# Patient Record
Sex: Male | Born: 1965 | Race: White | Hispanic: No | Marital: Single | Smoking: Never smoker
Health system: Southern US, Community
[De-identification: ages and names within clinical notes are randomized; demographics above are authoritative.]

## PROBLEM LIST (undated history)

## (undated) DIAGNOSIS — T7840XA Allergy, unspecified, initial encounter: Secondary | ICD-10-CM

## (undated) DIAGNOSIS — M5030 Other cervical disc degeneration, unspecified cervical region: Secondary | ICD-10-CM

## (undated) DIAGNOSIS — M5136 Other intervertebral disc degeneration, lumbar region: Secondary | ICD-10-CM

## (undated) DIAGNOSIS — D649 Anemia, unspecified: Secondary | ICD-10-CM

## (undated) DIAGNOSIS — G5603 Carpal tunnel syndrome, bilateral upper limbs: Secondary | ICD-10-CM

## (undated) DIAGNOSIS — G4730 Sleep apnea, unspecified: Secondary | ICD-10-CM

## (undated) DIAGNOSIS — M51369 Other intervertebral disc degeneration, lumbar region without mention of lumbar back pain or lower extremity pain: Secondary | ICD-10-CM

## (undated) DIAGNOSIS — D759 Disease of blood and blood-forming organs, unspecified: Secondary | ICD-10-CM

## (undated) DIAGNOSIS — F329 Major depressive disorder, single episode, unspecified: Secondary | ICD-10-CM

## (undated) DIAGNOSIS — G709 Myoneural disorder, unspecified: Secondary | ICD-10-CM

## (undated) DIAGNOSIS — E119 Type 2 diabetes mellitus without complications: Secondary | ICD-10-CM

## (undated) DIAGNOSIS — L739 Follicular disorder, unspecified: Secondary | ICD-10-CM

## (undated) DIAGNOSIS — F32A Depression, unspecified: Secondary | ICD-10-CM

## (undated) DIAGNOSIS — M797 Fibromyalgia: Secondary | ICD-10-CM

## (undated) DIAGNOSIS — R519 Headache, unspecified: Secondary | ICD-10-CM

## (undated) DIAGNOSIS — E785 Hyperlipidemia, unspecified: Secondary | ICD-10-CM

## (undated) HISTORY — PX: NASAL SEPTUM SURGERY: SHX37

## (undated) HISTORY — DX: Fibromyalgia: M79.7

## (undated) HISTORY — DX: Type 2 diabetes mellitus without complications: E11.9

## (undated) HISTORY — PX: WISDOM TOOTH EXTRACTION: SHX21

## (undated) HISTORY — DX: Carpal tunnel syndrome, bilateral upper limbs: G56.03

## (undated) HISTORY — PX: UPPER GI ENDOSCOPY: SHX6162

## (undated) HISTORY — DX: Other intervertebral disc degeneration, lumbar region: M51.36

## (undated) HISTORY — DX: Follicular disorder, unspecified: L73.9

## (undated) HISTORY — DX: Allergy, unspecified, initial encounter: T78.40XA

## (undated) HISTORY — DX: Major depressive disorder, single episode, unspecified: F32.9

## (undated) HISTORY — DX: Hyperlipidemia, unspecified: E78.5

## (undated) HISTORY — DX: Depression, unspecified: F32.A

## (undated) HISTORY — DX: Sleep apnea, unspecified: G47.30

## (undated) HISTORY — PX: OTHER SURGICAL HISTORY: SHX169

## (undated) HISTORY — DX: Other intervertebral disc degeneration, lumbar region without mention of lumbar back pain or lower extremity pain: M51.369

## (undated) HISTORY — DX: Anemia, unspecified: D64.9

## (undated) HISTORY — DX: Myoneural disorder, unspecified: G70.9

## (undated) HISTORY — DX: Other cervical disc degeneration, unspecified cervical region: M50.30

## (undated) HISTORY — PX: COLONOSCOPY: SHX174

---

## 1999-08-10 ENCOUNTER — Encounter: Admission: RE | Admit: 1999-08-10 | Discharge: 1999-08-10 | Payer: Self-pay | Admitting: Family Medicine

## 2000-10-11 ENCOUNTER — Ambulatory Visit (HOSPITAL_COMMUNITY): Admission: RE | Admit: 2000-10-11 | Discharge: 2000-10-11 | Payer: Self-pay | Admitting: Neurosurgery

## 2000-10-11 ENCOUNTER — Encounter: Payer: Self-pay | Admitting: Neurosurgery

## 2002-01-07 ENCOUNTER — Encounter: Admission: RE | Admit: 2002-01-07 | Discharge: 2002-01-07 | Payer: Self-pay | Admitting: Family Medicine

## 2002-01-07 ENCOUNTER — Encounter: Payer: Self-pay | Admitting: Family Medicine

## 2002-09-20 ENCOUNTER — Encounter: Payer: Self-pay | Admitting: Family Medicine

## 2002-09-20 ENCOUNTER — Encounter: Admission: RE | Admit: 2002-09-20 | Discharge: 2002-09-20 | Payer: Self-pay | Admitting: Family Medicine

## 2003-09-19 ENCOUNTER — Encounter: Payer: Self-pay | Admitting: Internal Medicine

## 2003-10-14 ENCOUNTER — Encounter: Payer: Self-pay | Admitting: Internal Medicine

## 2003-11-22 ENCOUNTER — Ambulatory Visit (HOSPITAL_COMMUNITY): Admission: RE | Admit: 2003-11-22 | Discharge: 2003-11-22 | Payer: Self-pay | Admitting: Internal Medicine

## 2003-11-22 ENCOUNTER — Encounter: Payer: Self-pay | Admitting: Internal Medicine

## 2003-11-22 ENCOUNTER — Encounter (INDEPENDENT_AMBULATORY_CARE_PROVIDER_SITE_OTHER): Payer: Self-pay | Admitting: Specialist

## 2003-12-13 ENCOUNTER — Encounter: Payer: Self-pay | Admitting: Internal Medicine

## 2004-04-02 ENCOUNTER — Encounter: Admission: RE | Admit: 2004-04-02 | Discharge: 2004-04-02 | Payer: Self-pay | Admitting: Family Medicine

## 2004-04-19 ENCOUNTER — Encounter: Admission: RE | Admit: 2004-04-19 | Discharge: 2004-04-19 | Payer: Self-pay | Admitting: Family Medicine

## 2004-05-07 ENCOUNTER — Encounter: Admission: RE | Admit: 2004-05-07 | Discharge: 2004-05-07 | Payer: Self-pay | Admitting: Family Medicine

## 2004-05-23 ENCOUNTER — Encounter: Admission: RE | Admit: 2004-05-23 | Discharge: 2004-05-23 | Payer: Self-pay | Admitting: Family Medicine

## 2004-09-11 ENCOUNTER — Ambulatory Visit: Payer: Self-pay | Admitting: Internal Medicine

## 2004-10-11 ENCOUNTER — Ambulatory Visit: Payer: Self-pay | Admitting: Internal Medicine

## 2004-11-13 ENCOUNTER — Ambulatory Visit: Payer: Self-pay | Admitting: Internal Medicine

## 2004-12-12 ENCOUNTER — Ambulatory Visit: Payer: Self-pay | Admitting: Internal Medicine

## 2005-01-09 ENCOUNTER — Ambulatory Visit: Payer: Self-pay | Admitting: Internal Medicine

## 2005-01-19 IMAGING — US US BIOPSY
1 series · 9 of 9 positions shown · non-contrast
Comparison: none

CLINICAL DATA: 37-year-old male with iron overload. Request to perform liver core biopsy. 
ULTRASOUND GUIDED RANDOM LIVER CORE BIOPSY 11/22/03
The above procedure was thoroughly discussed with the patient and questions were answered.  Risks, benefits and alternatives were also delineated. Risks specifically discussed included bleeding, bruising, infection, and less likely death. The patient understood and wishes to proceed. Verbal as well as written consent was obtained.
Ultrasound was first performed to localize an adequate site for the biopsy. This area was then marked. The patient was then prepped and draped in a normal sterile fashion. One percent lidocaine was used for local anesthesia. Using direct ultrasound guidance, a 17-gauge Trocar type needle was then introduced into the periphery of the right hepatic lobe. An 18-gauge core biopsy gun was then advanced to obtain four core specimens. The specimens were then placed in saline and sent to pathology for further analysis. Post-procedure imaging demonstrated no hematoma. The patient tolerated the procedure and there were no immediate complications. The patient was then sent to the outpatient medical unit to be observed for three hours post-procedure. 
Patient received 10 mg PO Valium prior to the start of the procedure. 
Dr. Damarys Jaeger supervised the above procedure. 
IMPRESSION
Successful ultrasound guided random liver core biopsy.

[Series 1: unknown · 0.32mm/px · 9 of 9 slices shown]
[im 1/9]
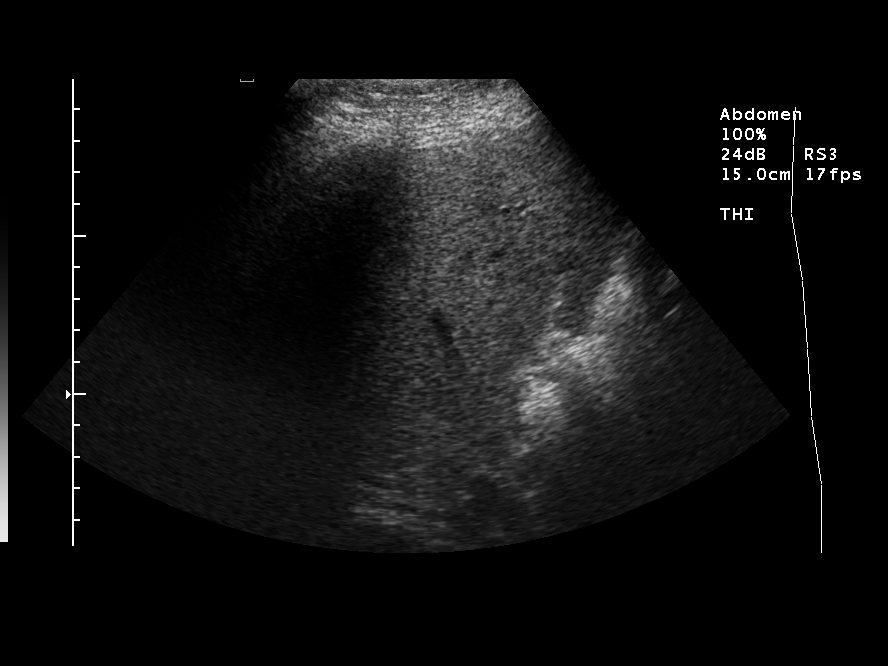
[im 2/9]
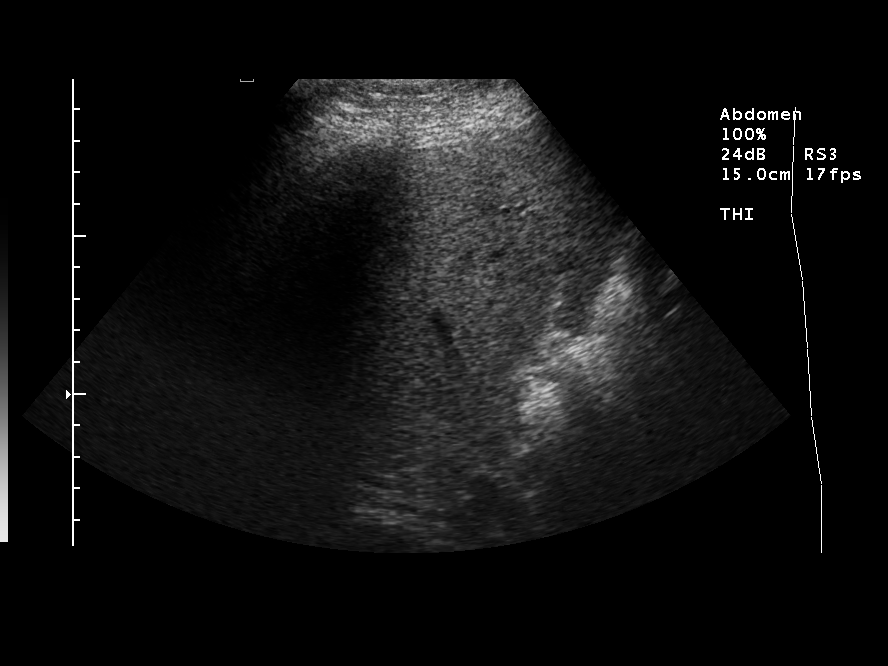
[im 3/9]
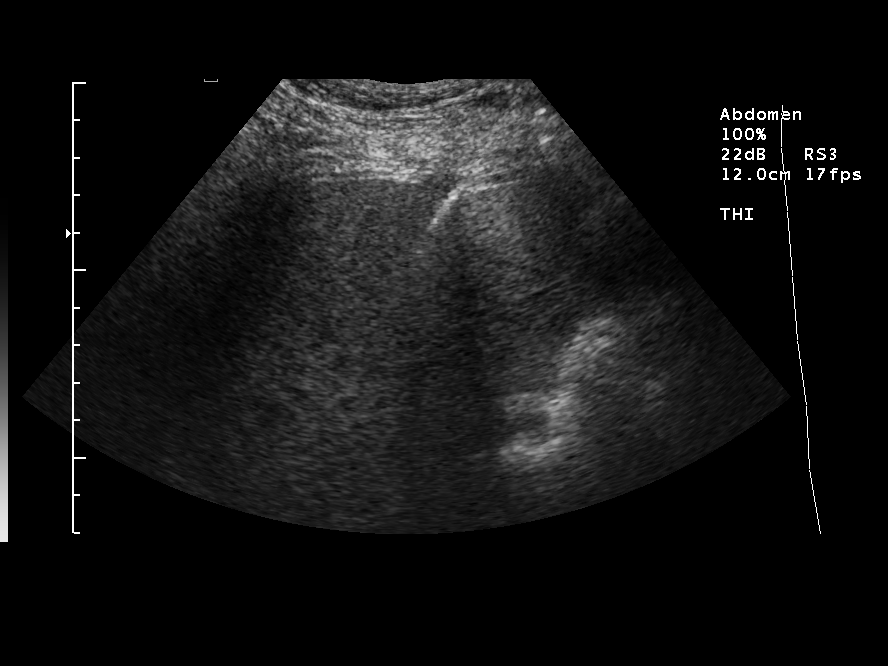
[im 4/9]
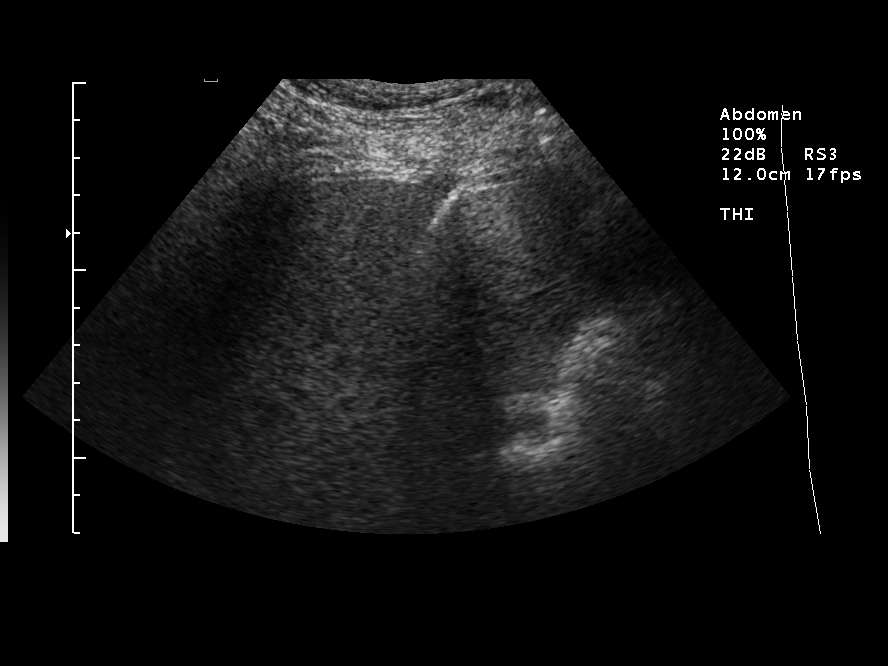
[im 5/9]
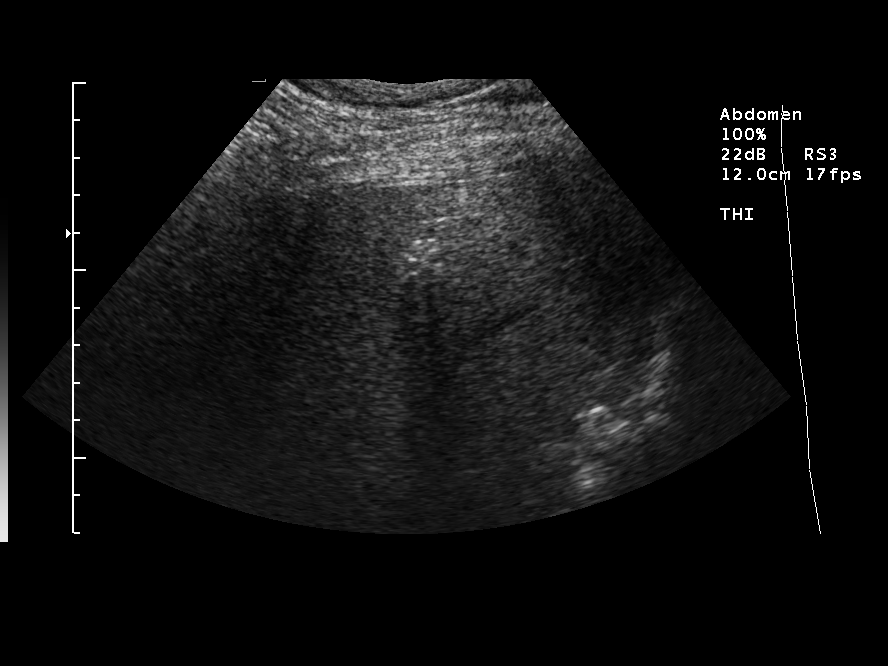
[im 6/9]
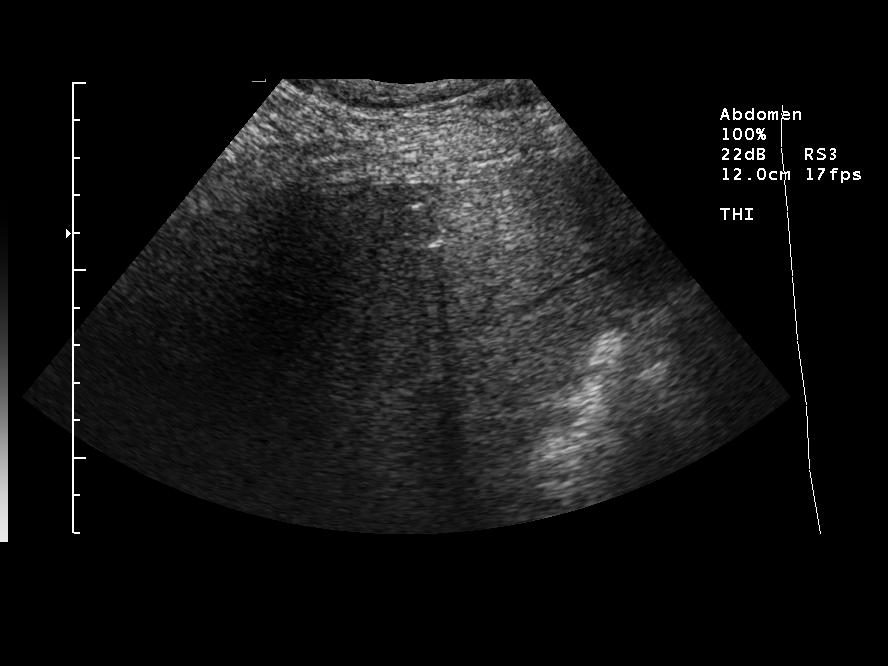
[im 7/9]
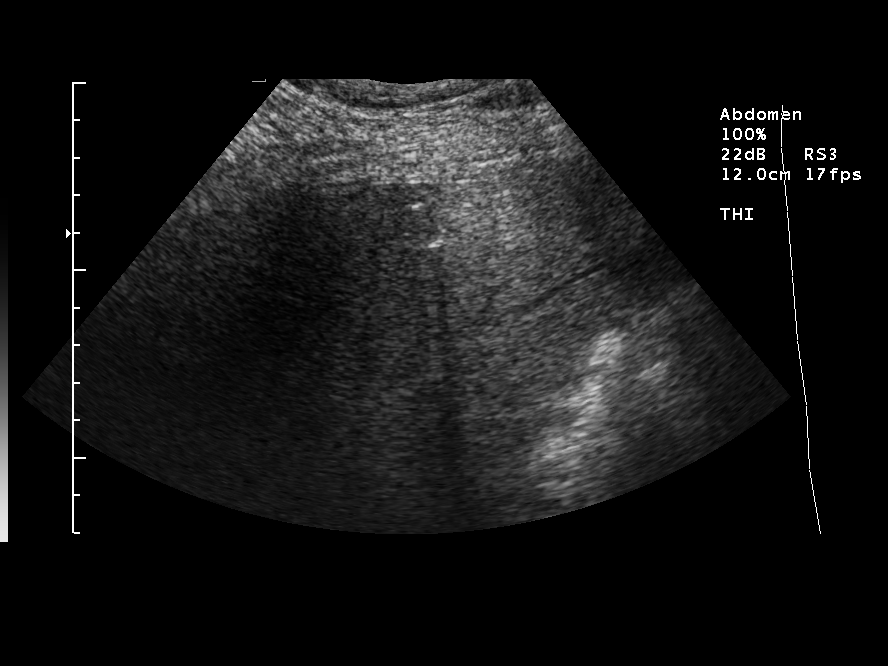
[im 8/9]
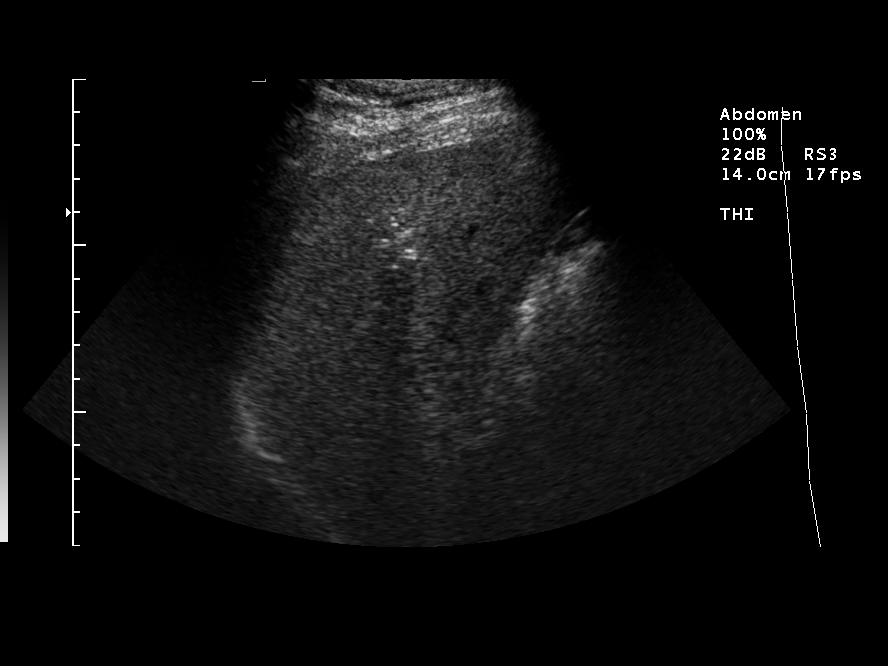
[im 9/9]
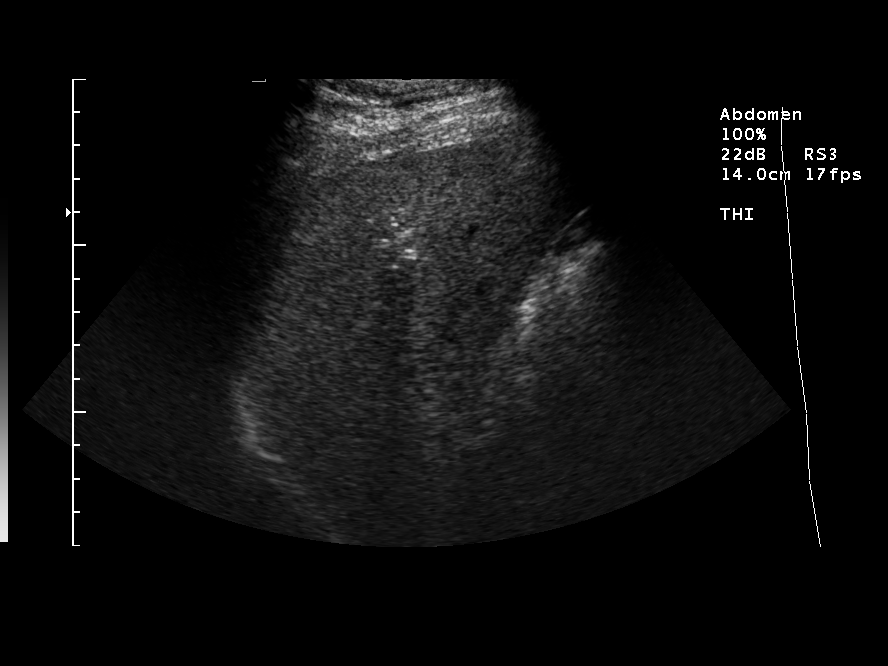

[9 of 9 positions shown; findings below may reference images not displayed]

## 2005-02-11 ENCOUNTER — Ambulatory Visit: Payer: Self-pay | Admitting: Internal Medicine

## 2005-02-11 ENCOUNTER — Encounter: Admission: RE | Admit: 2005-02-11 | Discharge: 2005-05-12 | Payer: Self-pay | Admitting: Neurological Surgery

## 2005-03-11 ENCOUNTER — Ambulatory Visit: Payer: Self-pay | Admitting: Internal Medicine

## 2005-04-02 ENCOUNTER — Ambulatory Visit: Payer: Self-pay | Admitting: Internal Medicine

## 2005-05-08 ENCOUNTER — Ambulatory Visit: Payer: Self-pay | Admitting: Internal Medicine

## 2005-06-28 ENCOUNTER — Ambulatory Visit: Payer: Self-pay | Admitting: Internal Medicine

## 2005-10-10 ENCOUNTER — Ambulatory Visit: Payer: Self-pay | Admitting: Pulmonary Disease

## 2005-10-10 ENCOUNTER — Ambulatory Visit: Payer: Self-pay | Admitting: Internal Medicine

## 2005-11-12 ENCOUNTER — Ambulatory Visit: Payer: Self-pay | Admitting: Pulmonary Disease

## 2005-12-13 ENCOUNTER — Ambulatory Visit: Payer: Self-pay | Admitting: Pulmonary Disease

## 2006-01-05 ENCOUNTER — Ambulatory Visit (HOSPITAL_BASED_OUTPATIENT_CLINIC_OR_DEPARTMENT_OTHER): Admission: RE | Admit: 2006-01-05 | Discharge: 2006-01-05 | Payer: Self-pay | Admitting: Pulmonary Disease

## 2006-01-14 ENCOUNTER — Ambulatory Visit: Payer: Self-pay | Admitting: Pulmonary Disease

## 2006-01-27 ENCOUNTER — Ambulatory Visit: Payer: Self-pay | Admitting: Pulmonary Disease

## 2006-06-06 ENCOUNTER — Ambulatory Visit: Payer: Self-pay | Admitting: Internal Medicine

## 2006-06-10 ENCOUNTER — Ambulatory Visit: Payer: Self-pay | Admitting: Internal Medicine

## 2006-07-18 ENCOUNTER — Ambulatory Visit: Payer: Self-pay | Admitting: Internal Medicine

## 2006-08-15 ENCOUNTER — Ambulatory Visit: Payer: Self-pay | Admitting: Internal Medicine

## 2006-08-15 LAB — CONVERTED CEMR LAB: HCT: 41.5 % (ref 39.0–52.0)

## 2006-09-11 ENCOUNTER — Ambulatory Visit: Payer: Self-pay | Admitting: Internal Medicine

## 2006-09-11 LAB — CONVERTED CEMR LAB
HCT: 41.5 % (ref 39.0–52.0)
Hemoglobin: 14.4 g/dL (ref 13.0–17.0)

## 2006-10-10 ENCOUNTER — Ambulatory Visit: Payer: Self-pay | Admitting: Internal Medicine

## 2006-10-10 LAB — CONVERTED CEMR LAB: Ferritin: 27.3 ng/mL (ref 22.0–322.0)

## 2006-12-19 ENCOUNTER — Ambulatory Visit: Payer: Self-pay | Admitting: Internal Medicine

## 2006-12-19 LAB — CONVERTED CEMR LAB: Hemoglobin: 14.5 g/dL (ref 13.0–17.0)

## 2007-02-13 ENCOUNTER — Ambulatory Visit: Payer: Self-pay | Admitting: Internal Medicine

## 2007-02-18 ENCOUNTER — Observation Stay (HOSPITAL_COMMUNITY): Admission: RE | Admit: 2007-02-18 | Discharge: 2007-02-19 | Payer: Self-pay | Admitting: Otolaryngology

## 2007-04-17 ENCOUNTER — Ambulatory Visit: Payer: Self-pay | Admitting: Internal Medicine

## 2007-04-17 LAB — CONVERTED CEMR LAB
HCT: 39.6 % (ref 39.0–52.0)
Hemoglobin: 14 g/dL (ref 13.0–17.0)

## 2007-06-12 ENCOUNTER — Ambulatory Visit: Payer: Self-pay | Admitting: Internal Medicine

## 2007-06-12 LAB — CONVERTED CEMR LAB
Basophils Absolute: 0 10*3/uL (ref 0.0–0.1)
Eosinophils Absolute: 0.1 10*3/uL (ref 0.0–0.6)
Eosinophils Relative: 2.4 % (ref 0.0–5.0)
Lymphocytes Relative: 32.6 % (ref 12.0–46.0)
MCHC: 35.3 g/dL (ref 30.0–36.0)
MCV: 93.6 fL (ref 78.0–100.0)
Neutro Abs: 2.9 10*3/uL (ref 1.4–7.7)

## 2007-08-12 ENCOUNTER — Ambulatory Visit: Payer: Self-pay | Admitting: Internal Medicine

## 2007-10-16 ENCOUNTER — Ambulatory Visit: Payer: Self-pay | Admitting: Internal Medicine

## 2007-10-16 LAB — CONVERTED CEMR LAB: HCT: 41 % (ref 39.0–52.0)

## 2007-12-10 ENCOUNTER — Ambulatory Visit: Payer: Self-pay | Admitting: Internal Medicine

## 2007-12-10 LAB — CONVERTED CEMR LAB
Ferritin: 33.1 ng/mL (ref 22.0–322.0)
Hemoglobin: 15 g/dL (ref 13.0–17.0)

## 2008-02-12 ENCOUNTER — Ambulatory Visit: Payer: Self-pay | Admitting: Internal Medicine

## 2008-04-08 ENCOUNTER — Ambulatory Visit: Payer: Self-pay | Admitting: Internal Medicine

## 2008-04-13 LAB — CONVERTED CEMR LAB: Ferritin: 28 ng/mL (ref 22.0–322.0)

## 2008-06-17 ENCOUNTER — Ambulatory Visit: Payer: Self-pay | Admitting: Internal Medicine

## 2008-06-21 LAB — CONVERTED CEMR LAB
HCT: 39.8 % (ref 39.0–52.0)
Hemoglobin: 14.1 g/dL (ref 13.0–17.0)

## 2008-08-09 ENCOUNTER — Ambulatory Visit: Payer: Self-pay | Admitting: Internal Medicine

## 2008-10-19 ENCOUNTER — Ambulatory Visit: Payer: Self-pay | Admitting: Internal Medicine

## 2008-10-26 LAB — CONVERTED CEMR LAB: Ferritin: 35.2 ng/mL (ref 22.0–322.0)

## 2008-11-07 ENCOUNTER — Encounter: Payer: Self-pay | Admitting: Internal Medicine

## 2008-11-25 ENCOUNTER — Encounter: Payer: Self-pay | Admitting: Internal Medicine

## 2008-12-16 ENCOUNTER — Ambulatory Visit: Payer: Self-pay | Admitting: Internal Medicine

## 2008-12-20 LAB — CONVERTED CEMR LAB
Ferritin: 27.8 ng/mL (ref 22.0–322.0)
HCT: 43.1 % (ref 39.0–52.0)
Hemoglobin: 15.2 g/dL (ref 13.0–17.0)

## 2009-01-25 ENCOUNTER — Encounter: Payer: Self-pay | Admitting: Internal Medicine

## 2009-01-29 ENCOUNTER — Encounter: Payer: Self-pay | Admitting: Internal Medicine

## 2009-01-30 ENCOUNTER — Encounter: Payer: Self-pay | Admitting: Internal Medicine

## 2009-02-10 ENCOUNTER — Ambulatory Visit: Payer: Self-pay | Admitting: Internal Medicine

## 2009-04-11 ENCOUNTER — Ambulatory Visit: Payer: Self-pay | Admitting: Internal Medicine

## 2009-04-12 LAB — CONVERTED CEMR LAB
HCT: 39.2 % (ref 39.0–52.0)
Hemoglobin: 14.1 g/dL (ref 13.0–17.0)

## 2009-06-09 ENCOUNTER — Ambulatory Visit: Payer: Self-pay | Admitting: Internal Medicine

## 2009-07-10 LAB — CONVERTED CEMR LAB
HCT: 44.7 % (ref 39.0–52.0)
Hemoglobin: 15.2 g/dL (ref 13.0–17.0)

## 2009-08-05 ENCOUNTER — Encounter: Payer: Self-pay | Admitting: Internal Medicine

## 2009-08-16 ENCOUNTER — Ambulatory Visit: Payer: Self-pay | Admitting: Internal Medicine

## 2009-08-17 LAB — CONVERTED CEMR LAB: Hemoglobin: 15.9 g/dL (ref 13.0–17.0)

## 2009-08-23 ENCOUNTER — Telehealth: Payer: Self-pay | Admitting: Internal Medicine

## 2009-09-01 ENCOUNTER — Encounter: Payer: Self-pay | Admitting: Internal Medicine

## 2009-10-10 ENCOUNTER — Ambulatory Visit: Payer: Self-pay | Admitting: Internal Medicine

## 2009-10-12 LAB — CONVERTED CEMR LAB: Hemoglobin: 15.6 g/dL (ref 13.0–17.0)

## 2009-12-15 ENCOUNTER — Ambulatory Visit: Payer: Self-pay | Admitting: Internal Medicine

## 2009-12-18 LAB — CONVERTED CEMR LAB: HCT: 42 % (ref 39.0–52.0)

## 2009-12-28 ENCOUNTER — Encounter (INDEPENDENT_AMBULATORY_CARE_PROVIDER_SITE_OTHER): Payer: Self-pay | Admitting: *Deleted

## 2010-01-30 ENCOUNTER — Ambulatory Visit: Payer: Self-pay | Admitting: Internal Medicine

## 2010-02-08 ENCOUNTER — Ambulatory Visit: Payer: Self-pay | Admitting: Internal Medicine

## 2010-02-08 ENCOUNTER — Encounter: Admission: RE | Admit: 2010-02-08 | Discharge: 2010-02-08 | Payer: Self-pay | Admitting: Family Medicine

## 2010-02-09 LAB — CONVERTED CEMR LAB
Ferritin: 24.1 ng/mL
HCT: 43.5 %
Hemoglobin: 15.2 g/dL

## 2010-04-20 ENCOUNTER — Ambulatory Visit: Payer: Self-pay | Admitting: Internal Medicine

## 2010-06-15 ENCOUNTER — Ambulatory Visit: Payer: Self-pay | Admitting: Internal Medicine

## 2010-06-19 LAB — CONVERTED CEMR LAB: Hemoglobin: 14.8 g/dL (ref 13.0–17.0)

## 2010-08-14 ENCOUNTER — Ambulatory Visit: Payer: Self-pay | Admitting: Internal Medicine

## 2010-08-14 LAB — CONVERTED CEMR LAB
Basophils Relative: 0.8 % (ref 0.0–3.0)
Eosinophils Absolute: 0.1 10*3/uL (ref 0.0–0.7)
Hemoglobin: 14.7 g/dL (ref 13.0–17.0)
Lymphocytes Relative: 25.2 % (ref 12.0–46.0)
MCV: 95 fL (ref 78.0–100.0)
Neutrophils Relative %: 60 % (ref 43.0–77.0)
Platelets: 222 10*3/uL (ref 150.0–400.0)
RBC: 4.36 M/uL (ref 4.22–5.81)
RDW: 13 % (ref 11.5–14.6)
WBC: 5.4 10*3/uL (ref 4.5–10.5)

## 2010-08-15 ENCOUNTER — Telehealth (INDEPENDENT_AMBULATORY_CARE_PROVIDER_SITE_OTHER): Payer: Self-pay | Admitting: *Deleted

## 2010-10-15 ENCOUNTER — Ambulatory Visit: Payer: Self-pay | Admitting: Internal Medicine

## 2010-10-23 LAB — CONVERTED CEMR LAB
Basophils Relative: 0.3 % (ref 0.0–3.0)
Eosinophils Relative: 2 % (ref 0.0–5.0)
Hemoglobin: 14.2 g/dL (ref 13.0–17.0)
Lymphocytes Relative: 24 % (ref 12.0–46.0)
MCHC: 35.1 g/dL (ref 30.0–36.0)
Monocytes Relative: 10.1 % (ref 3.0–12.0)
Neutro Abs: 3.3 10*3/uL (ref 1.4–7.7)
Neutrophils Relative %: 63.6 % (ref 43.0–77.0)
RBC: 4.24 M/uL (ref 4.22–5.81)
WBC: 5.3 10*3/uL (ref 4.5–10.5)

## 2010-11-26 ENCOUNTER — Other Ambulatory Visit: Payer: Self-pay | Admitting: Family Medicine

## 2010-11-26 DIAGNOSIS — R110 Nausea: Secondary | ICD-10-CM

## 2010-11-26 DIAGNOSIS — R1011 Right upper quadrant pain: Secondary | ICD-10-CM

## 2010-11-27 NOTE — Letter (Signed)
Summary: New Patient letter  [facility]  [address] [place], [place] [address]   Phone: [phone]  Fax: [phone]       12/28/2009 MRN: [id number]  [facility] [address] [place], [place]  [address]  Dear [name],  Welcome to the Gastroenterology Division at [facility].    You are scheduled to see Dr.  Stan Head on January 30, 2010 at 9:00am on the 3rd floor at [facility], [address]  We ask that you try to arrive at our office 15 minutes prior to your appointment time to allow for check-in.  We would like you to complete the enclosed self-administered evaluation form prior to your visit and bring it with you on the day of your appointment.  We will review it with you.  Also, please bring a complete list of all your medications or, if you prefer, bring the medication bottles and we will list them.  Please bring your insurance card so that we may make a copy of it.  If your insurance requires a referral to see a specialist, please bring your referral form from your primary care physician.  Co-payments are due at the time of your visit and may be paid by cash, check or credit card.     Your office visit will consist of a consult with your physician (includes a physical exam), any laboratory testing he/she may order, scheduling of any necessary diagnostic testing (e.g. x-ray, ultrasound, CT-scan), and scheduling of a procedure (e.g. Endoscopy, Colonoscopy) if required.  Please allow enough time on your schedule to allow for any/all of these possibilities.    If you cannot keep your appointment, please call [phone] to cancel or reschedule prior to your appointment date.  This allows [place] the opportunity to schedule an appointment for another patient in need of care.  If you do not cancel or reschedule by 5 p.m. the business day prior to your appointment date, you will be charged a $50.00 late cancellation/no-show fee.    Thank you for  choosing [facility] for your medical needs.  We appreciate the opportunity to care for you.  Please visit [place] at our website  to learn more about our practice.                     Sincerely,                                                             The Gastroenterology Division

## 2010-11-27 NOTE — Assessment & Plan Note (Signed)
Summary: f.u after blood work...em   History of Present Illness Visit Type: Initial Visit Primary GI MD: Stan Head MD [facility] Primary Provider: Mosetta Putt, MD Chief Complaint: Hemochromatosis History of Present Illness:   No GI complaints. Here for follow-up of hemchromatosis. Fibromyalgia and back/neck pain are main issues, intermittent. If he overdoes it gets sore all over.   GI Review of Systems      Denies abdominal pain, acid reflux, belching, bloating, chest pain, dysphagia with liquids, dysphagia with solids, heartburn, loss of appetite, nausea, vomiting, vomiting blood, weight loss, and  weight gain.        Denies anal fissure, black tarry stools, change in bowel habit, constipation, diarrhea, diverticulosis, fecal incontinence, heme positive stool, hemorrhoids, irritable bowel syndrome, jaundice, light color stool, liver problems, rectal bleeding, and  rectal pain. Preventive Screening-Counseling & Management  Alcohol-Tobacco     Smoking Status: never      Drug Use:  no.      Liver Biopsy  Procedure date:  11/22/2003  Findings:      Mild steatosis and increased iron Hepatic iron index 1.6   Current Medications (verified): 1)  Hydrocodone-Acetaminophen 5-500 Mg Tabs (Hydrocodone-Acetaminophen) .... As Needed For Pain 2)  Gabapentin 600 Mg Tabs (Gabapentin) .... Take 1 -2 Tablets Daily As Needed 3)  Cyclobenzaprine Hcl 10 Mg Tabs (Cyclobenzaprine Hcl) .... As Needed 4)  Nabumetone 750 Mg Tabs (Nabumetone) .... Take 1 Tablet Two Times A Day After Meals 5)  Amitriptyline Hcl 25 Mg Tabs (Amitriptyline Hcl) .... Take 2 -3 Tablet At Bedtime 6)  Lipitor 40 Mg Tabs (Atorvastatin Calcium) .... 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  Alcohol Use - yes 1 drink to 2-3 drinks/week Daily Caffeine Use Illicit Drug Use - no Smoking Status:  never Drug Use:  no  Review of Systems       The patient complains of allergy/sinus and back pain.    Vital Signs:  Patient profile:   45 year old male Height:      68 inches Weight:      188.38 pounds BMI:     28.75 Pulse rate:   76 / minute Pulse rhythm:   regular BP sitting:   130 / 70  (left arm) Cuff size:   regular  Vitals Entered By: June McMurray CMA Duncan Dull) (January 30, 2010 8:59 AM)  Physical Exam  General:  Well developed, well nourished, no acute distress. Eyes:  anicteric Lungs:  Clear throughout to auscultation. Heart:  Regular rate and rhythm; no murmurs, rubs,  or bruits. Abdomen:  Soft, nontender and nondistended. No masses, hepatosplenomegaly or hernias noted. Normal bowel sounds. 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  2)  Continue with phlebotomy  plan as you are and avoiding foods high in iron. 3)  Copy sent to : Mosetta Putt, MD 4)  The medication list was reviewed and reconciled.  All changed / newly prescribed medications were explained.  A complete medication list was provided to the patient / caregiver.

## 2010-11-28 ENCOUNTER — Ambulatory Visit
Admission: RE | Admit: 2010-11-28 | Discharge: 2010-11-28 | Disposition: A | Discharge status: Home / Self Care | Payer: PRIVATE HEALTH INSURANCE | Source: Ambulatory Visit | Attending: Family Medicine | Admitting: Family Medicine

## 2010-11-28 DIAGNOSIS — R110 Nausea: Secondary | ICD-10-CM

## 2010-11-28 DIAGNOSIS — R1011 Right upper quadrant pain: Secondary | ICD-10-CM

## 2010-11-29 NOTE — Progress Notes (Signed)
Summary: continue every 2 month phlebotomy, ferritin is 22  Phone Note Outgoing Call Call back at Work Phone [phone]   Summary of Call: let him know things are ok with iron level  continue phlebotomy every 2 months (do not need to check ferritin til I say to again) Iva Boop MD, [facility]  August 15, 2010 3:10 PM   Follow-up for Phone Call        pt notified of above.  Order entered into IDX.  Flag to myself to call and remind pt to come for phlebotomy. Follow-up by: Francee Piccolo CMA Duncan Dull),  August 16, 2010 2:34 PM     Appended Document: continue every 2 month phlebotomy, ferritin is 22 message left with pt wife that pt is due for phlebotomy next week

## 2010-12-20 ENCOUNTER — Other Ambulatory Visit: Payer: Self-pay | Admitting: Internal Medicine

## 2010-12-20 ENCOUNTER — Other Ambulatory Visit: Payer: PRIVATE HEALTH INSURANCE

## 2010-12-20 ENCOUNTER — Encounter (INDEPENDENT_AMBULATORY_CARE_PROVIDER_SITE_OTHER): Payer: Self-pay | Admitting: *Deleted

## 2010-12-20 LAB — CBC WITH DIFFERENTIAL/PLATELET
Basophils Absolute: 0 10*3/uL (ref 0.0–0.1)
Basophils Relative: 0.5 % (ref 0.0–3.0)
Eosinophils Absolute: 0.1 10*3/uL (ref 0.0–0.7)
HCT: 41.2 % (ref 39.0–52.0)
Hemoglobin: 14.6 g/dL (ref 13.0–17.0)
Lymphs Abs: 1.5 10*3/uL (ref 0.7–4.0)
MCHC: 35.5 g/dL (ref 30.0–36.0)
MCV: 95.3 fl (ref 78.0–100.0)
Neutro Abs: 3.4 10*3/uL (ref 1.4–7.7)
RBC: 4.33 Mil/uL (ref 4.22–5.81)
RDW: 12.8 % (ref 11.5–14.6)

## 2011-02-13 ENCOUNTER — Telehealth: Payer: Self-pay | Admitting: *Deleted

## 2011-02-13 ENCOUNTER — Other Ambulatory Visit (INDEPENDENT_AMBULATORY_CARE_PROVIDER_SITE_OTHER): Payer: PRIVATE HEALTH INSURANCE

## 2011-02-13 LAB — CBC WITH DIFFERENTIAL/PLATELET
Basophils Absolute: 0 10*3/uL (ref 0.0–0.1)
Eosinophils Absolute: 0.1 10*3/uL (ref 0.0–0.7)
Hemoglobin: 14.9 g/dL (ref 13.0–17.0)
Lymphocytes Relative: 24.3 % (ref 12.0–46.0)
Lymphs Abs: 1.3 10*3/uL (ref 0.7–4.0)
MCHC: 35.4 g/dL (ref 30.0–36.0)
Monocytes Relative: 7.5 % (ref 3.0–12.0)
Neutro Abs: 3.5 10*3/uL (ref 1.4–7.7)
Platelets: 209 10*3/uL (ref 150.0–400.0)
RDW: 13 % (ref 11.5–14.6)

## 2011-02-13 LAB — FERRITIN: Ferritin: 17.7 ng/mL — ABNORMAL LOW (ref 22.0–322.0)

## 2011-02-13 NOTE — Telephone Encounter (Signed)
Patient aware of Dr. Marvell Fuller recommendations

## 2011-02-13 NOTE — Progress Notes (Signed)
Quick Note:  Continue with phlebotomy every 2 months ______

## 2011-02-13 NOTE — Telephone Encounter (Signed)
Message copied by Jesse Fall on Wed Feb 13, 2011  3:04 PM ------      Message from: Stan Head      Created: Wed Feb 13, 2011  1:10 PM       Continue with phlebotomy every 2 months

## 2011-02-14 ENCOUNTER — Other Ambulatory Visit: Payer: Self-pay | Admitting: Internal Medicine

## 2011-02-19 ENCOUNTER — Other Ambulatory Visit: Payer: Self-pay | Admitting: Family Medicine

## 2011-03-15 NOTE — Assessment & Plan Note (Signed)
[facility]                           GASTROENTEROLOGY OFFICE NOTE   NAME:[name], [name]                     MRN:          [id number]  DATE:06/10/2006                            DOB:          01/04/1966   CHIEF COMPLAINT:  Followup of hemochromatosis.   HISTORY:  [name] has been well without any GI complaints.  He was due  to have labs in April but somehow, we did not notify him.  His ferritin had  dropped to below 50 back in December 2006.  He has not had phlebotomy for  about 6 months.  Perhaps a little longer.  He had seen [name] for  obstructive sleep apnea but could not tolerate the BiPAP and had it changed  but has not been back to see him and he is not using the BiPAP.   CURRENT MEDICATIONS:  Listed and reviewed in the chart.   He has lost a few pounds intentionally and his pain medications have been  altered a little bit, reduced in dose.  He has a chronic pain syndrome.   PAST MEDICAL HISTORY:  1. Chronic pain from cervical disease.  2. History of dyslipidemia.  3. Obstructive sleep apnea, documented on nocturnal polysomnography.   VITAL SIGNS:  Weight 174 pounds, pulse 78, blood pressure 128/76.   ASSESSMENT:  Hemachromatosis.   We had depleted his Coffey significantly previously.  His ferritin now is  actually 104.   PLAN:  He needs to restart phlebotomy which we will do monthly and then in  November at his phlebotomy, I will recheck a ferritin, and a CBC at that  time.  The plan is to keep his ferritin less than 50.  May need to go to an  every other month schedule, that way he stays on some sort of schedule and  we are less likely to loose track of things.   Note, I have instructed him to obtain followup with [name].  The plan  was for followup six months or p.r.n. and since he is off the BiPAP, it  sounds like he needs to go back and discuss options.  It looks like Requip  was going to be considered.                              Iva Boop, MD, [facility]   [name]  DD:  06/10/2006  DT:  06/10/2006  Job #:  [id number]   cc:   Brian Putt, MD  Brian Share, MD, [facility]

## 2011-03-15 NOTE — Op Note (Signed)
NAME:  [name], [name]              ACCOUNT NO.:  [id number]   MEDICAL RECORD NO.:  [id number]          PATIENT TYPE:  AMB   LOCATION:  SDS                          FACILITY:  [facility]   PHYSICIAN:  Kinnie Scales. Annalee Genta, M.D.DATE OF BIRTH:  Apr 13, 1966   DATE OF PROCEDURE:  02/18/2007  DATE OF DISCHARGE:                               OPERATIVE REPORT   POSTOPERATIVE DIAGNOSES:  1. Deviated nasal septum with nasal airway obstruction.  2. Inferior turbinate hypertrophy.  3. Obstructive sleep apnea.   INDICATIONS FOR SURGERY:  1. Deviated nasal septum with nasal airway obstruction.  2. Inferior turbinate hypertrophy.  3. Obstructive sleep apnea.   SURGICAL PROCEDURES:  1. Nasal septoplasty.  2. Bilateral inferior turbinate reduction.   SURGEON:  Kinnie Scales. Annalee Genta, M.D.   COMPLICATIONS:  None.   ESTIMATED BLOOD LOSS:  Approximately 50 mL.   COMPLICATIONS:  None.  The patient transferred to the operating room to  the recovery room in stable condition.   BRIEF HISTORY:  [name] is a 45 year old white male who is referred  for evaluation of airway obstruction.  He had a history of obstructive  sleep apnea and complained of significant issues with daytime fatigue  and hypersomnolence.  He was diagnosed with severe obstructive sleep  apnea and used CPAP and BiPAP.  Unfortunately, he had significant  difficulty wearing the device because of airway obstruction and  congestion.  He was referred to our office for further workup and  treatment.  Evaluation in the office showed a severely deviated septum  with septal spurring at a rate of 90% airway obstruction.  He had  bilateral inferior turbinate hypertrophy which contributed to his  difficulty wearing CPAP.  Given his history, examination and findings, I  recommended he undertake nasal surgery to improve nasal airway and CPAP  compliance.  The risks, benefits and possible complications of the  procedure were discussed in detail with  the patient prior to surgery.  He understood and concurred with our plan which was scheduled for [facility] OR with an overnight postoperative observation.   DESCRIPTION OF PROCEDURE:  The patient was brought to the operating room  on 02/18/2007, placed in the supine position on the operating table.  General endotracheal anesthesia was established without difficulty.  The  patient was adequately anesthetized.  His nasal cavity was examined and  he was injected with 8 mL of 1% lidocaine with 1:100,000 solution  epinephrine in submucosal fashion along the nasal septum and inferior  turbinates bilaterally.  The patient's nose was then packed with Afrin-  soaked cottonoid pledgets which were left in place for approximately 10  minutes to allow for vasoconstriction and hemostasis.  The patient was  positioned on the operating room, prepped and draped in sterile fashion  and surgical procedure was begun.   A left hemitransfixion incision was created and mucoperichondrial flap  was elevated from anterior to posterior along the patient's left-hand  side.  Deviated bone and cartilage were mobilized and the bony  cartilaginous junction was crossed in the midline elevating the  mucoperiosteal flap.  Along the  patient's right anterior and dorsal  columellar, cartilage was not deviated and this was left intact.  Mid  septal cartilage was resected, morselized and returned the  mucoperichondrial pocket at the conclusion of the surgical procedure.  The patient had significant bony septal spurring with obstruction  bilaterally.  A 4 mm osteotome was used to mobilize the bony deviation  and resect this section of the septum, preserving the overlying mucosa.  With the morselized cartilage returned to the mucoperichondrial pocket,  the flaps were reapproximated with a 4-0 gut suture on a Keith needle in  a horizontal mattress fashion.  Bilateral Doyle nasal septal splints  were then placed  after the application of Bactroban ointment and were  sutured in position with a 3-0 Ethilon suture.  Prior to closure, the  mucoperichondrial flaps were reapproximated and the hemitransfixion  incision was closed with a 4-0 gut suture on a Keith needle.   Bilateral inferior turbinate reduction was performed with bipolar  intramural cautery set at 12 watts.  Two submucosal passes were made in  each inferior turbinate.  When the turbinates were adequately  cauterized, they were outfractured.  An anterior incision was created in  each inferior turbinate.  A small amount of turbinate bone was resected,  preserving the overlying mucosa.  The patient nasal cavity is widely  patent at the conclusion of the procedure.  Nasal cavity and nasopharynx  were then irrigated and suctioned, and orogastric tube was passed.  The  stomach contents were aspirated.  The patient was then awakened from the  anesthetic.  He was extubated and transferred to the operating room to  the recovery room in stable condition.  No complications.  Blood loss  was approximately 50 mL.           ______________________________  Kinnie Scales. Annalee Genta, M.D.     DLS/MEDQ  D:  04/54/0981  T:  02/18/2007  Job:  [id number]

## 2011-03-15 NOTE — Procedures (Signed)
NAME:  [name], [name]              ACCOUNT NO.:  [id number]   MEDICAL RECORD NO.:  [id number]          PATIENT TYPE:  OUT   LOCATION:  SLEEP CENTER                 [facility]  [facility]   PHYSICIAN:  Marcelyn Bruins, M.D. [facility] DATE OF BIRTH:  1966/03/06   DATE OF STUDY:  01/05/2006                              NOCTURNAL POLYSOMNOGRAM   REFERRING PHYSICIAN:  Dr. Marcelyn Bruins.   DATE OF STUDY:  January 05, 2006.   INDICATION FOR STUDY:  Hypersomnia with sleep apnea. Patient has been  diagnosed with severe obstructive sleep apnea and is having difficulty with  wearing the device at night and also with persistent daytime sleepiness. He  returns for mask fitting, as well as BiPAP titration.   EPWORTH SCORE:  7.   SLEEP ARCHITECTURE:  The patient has a total sleep time of 415 minutes with  mildly decreased REM and very little slow wave sleep. Sleep onset latency  was normal and REM latency was increased at 132 minutes. Sleep efficiency  was fairly good at 95%.   RESPIRATORY DATA:  The patient was fitted with a ResMed Swift nasal pillow  device and BiPAP titration was initiated. The patient was titrated to a  inspiratory pressure of 9 cm and an expiratory pressure of 6 cm with fairly  good control of events. He did have breakthrough during his final REM.  Therefore, I would suggest an inspiratory pressure of 10 with an expiratory  pressure of 7.   OXYGEN DATA:  The patient had O2 desaturation as low as 85% with his  obstructive events.   CARDIAC DATA:  Occasional PVC was noted.   MOVEMENT/PARASOMNIA:  The patient was found to have 100 leg jerks with 1.6  per hour resulting in arousal or awakening.   IMPRESSION/RECOMMENDATIONS:  1.  Fairly good control of previously diagnosed severe obstructive sleep      apnea with a bilevel pressure and also a nasal Swift mask. The patient      had fairly good control with an inspiratory pressure of 10 and an      expiratory pressure of 6, however, there  was REM breakthrough noted      toward the end the study. I would therefore      recommend a treatment pressure of 10 cm on the inspiratory side and 7 cm      on the expiratory side.  2.  Large numbers of leg jerks with mild sleep disruption. Clinical      correlation is suggested.           ______________________________  Marcelyn Bruins, M.D. [facility]  Diplomate, American Board of Sleep  Medicine     KC/MEDQ  D:  01/14/2006 13:14:35  T:  01/15/2006 01:49:32  Job:  [id number]

## 2011-04-17 ENCOUNTER — Other Ambulatory Visit (INDEPENDENT_AMBULATORY_CARE_PROVIDER_SITE_OTHER): Payer: PRIVATE HEALTH INSURANCE

## 2011-04-17 ENCOUNTER — Telehealth: Payer: Self-pay | Admitting: *Deleted

## 2011-04-17 LAB — CBC WITH DIFFERENTIAL/PLATELET
Basophils Absolute: 0 10*3/uL (ref 0.0–0.1)
Eosinophils Relative: 2.1 % (ref 0.0–5.0)
HCT: 41.6 % (ref 39.0–52.0)
Hemoglobin: 14.9 g/dL (ref 13.0–17.0)
Lymphocytes Relative: 25.8 % (ref 12.0–46.0)
Lymphs Abs: 1.5 10*3/uL (ref 0.7–4.0)
Monocytes Relative: 9.9 % (ref 3.0–12.0)
Neutro Abs: 3.6 10*3/uL (ref 1.4–7.7)
Platelets: 216 10*3/uL (ref 150.0–400.0)
RDW: 13.2 % (ref 11.5–14.6)
WBC: 5.8 10*3/uL (ref 4.5–10.5)

## 2011-04-17 LAB — FERRITIN: Ferritin: 13.7 ng/mL — ABNORMAL LOW (ref 22.0–322.0)

## 2011-04-17 NOTE — Telephone Encounter (Signed)
shakira in lab stated pt was there for phlebotomy- needs CBC and Ferritin orders

## 2011-04-23 NOTE — Progress Notes (Signed)
Quick Note:  These are ok continue phlebotomy every 2 months Does NOT need a ferritin next time ______

## 2011-06-14 ENCOUNTER — Telehealth: Payer: Self-pay | Admitting: *Deleted

## 2011-06-14 ENCOUNTER — Other Ambulatory Visit (INDEPENDENT_AMBULATORY_CARE_PROVIDER_SITE_OTHER): Payer: PRIVATE HEALTH INSURANCE

## 2011-06-14 LAB — HEMOGLOBIN: Hemoglobin: 14.3 g/dL (ref 13.0–17.0)

## 2011-06-14 LAB — FERRITIN: Ferritin: 22.3 ng/mL (ref 22.0–322.0)

## 2011-06-14 LAB — HEMATOCRIT: HCT: 42.1 % (ref 39.0–52.0)

## 2011-06-14 NOTE — Telephone Encounter (Signed)
Lab needs orders for Hgb, hct, ferritin, phlebotomy

## 2011-06-16 NOTE — Progress Notes (Signed)
Quick Note:  Continue phlebotomy every 2 months ______ 

## 2011-08-23 ENCOUNTER — Other Ambulatory Visit (INDEPENDENT_AMBULATORY_CARE_PROVIDER_SITE_OTHER): Payer: PRIVATE HEALTH INSURANCE

## 2011-08-23 LAB — FERRITIN: Ferritin: 20.8 ng/mL — ABNORMAL LOW (ref 22.0–322.0)

## 2011-08-27 NOTE — Progress Notes (Signed)
Quick Note:  Continue phlebotomy every other month for hemochromatosis ______

## 2011-08-28 ENCOUNTER — Other Ambulatory Visit: Payer: Self-pay

## 2011-10-11 ENCOUNTER — Other Ambulatory Visit (INDEPENDENT_AMBULATORY_CARE_PROVIDER_SITE_OTHER): Payer: PRIVATE HEALTH INSURANCE

## 2011-10-11 ENCOUNTER — Other Ambulatory Visit: Payer: Self-pay | Admitting: Internal Medicine

## 2011-10-15 NOTE — Progress Notes (Signed)
Quick Note:  Continue phlebotomy every other month He does not need a ferritin next time - I will order when needed ______

## 2011-11-28 ENCOUNTER — Other Ambulatory Visit: Payer: Self-pay | Admitting: Family Medicine

## 2011-11-28 ENCOUNTER — Ambulatory Visit
Admission: RE | Admit: 2011-11-28 | Discharge: 2011-11-28 | Disposition: A | Discharge status: Home / Self Care | Payer: PRIVATE HEALTH INSURANCE | Source: Ambulatory Visit | Attending: Family Medicine | Admitting: Family Medicine

## 2011-11-28 DIAGNOSIS — R05 Cough: Secondary | ICD-10-CM

## 2011-12-13 ENCOUNTER — Other Ambulatory Visit (INDEPENDENT_AMBULATORY_CARE_PROVIDER_SITE_OTHER): Payer: PRIVATE HEALTH INSURANCE

## 2011-12-13 LAB — HEMOGLOBIN: Hemoglobin: 15.3 g/dL (ref 13.0–17.0)

## 2011-12-13 LAB — HEMATOCRIT: HCT: 44.4 % (ref 39.0–52.0)

## 2011-12-13 NOTE — Progress Notes (Signed)
Quick Note:  Repeat phlebotomy in 2 months ______ 

## 2012-02-07 ENCOUNTER — Ambulatory Visit: Payer: PRIVATE HEALTH INSURANCE

## 2012-02-07 ENCOUNTER — Other Ambulatory Visit: Payer: Self-pay | Admitting: *Deleted

## 2012-02-07 LAB — HEMOGLOBIN: Hemoglobin: 14.6 g/dL (ref 13.0–17.0)

## 2012-02-07 LAB — HEMATOCRIT: HCT: 42.7 % (ref 39.0–52.0)

## 2012-02-10 ENCOUNTER — Other Ambulatory Visit: Payer: Self-pay

## 2012-02-10 NOTE — Progress Notes (Signed)
Quick Note:  Repeat phlebotomy in 2 months and draw a ferritin then also ______

## 2012-02-28 ENCOUNTER — Other Ambulatory Visit: Payer: Self-pay | Admitting: Family Medicine

## 2012-02-28 ENCOUNTER — Ambulatory Visit
Admission: RE | Admit: 2012-02-28 | Discharge: 2012-02-28 | Disposition: A | Discharge status: Home / Self Care | Payer: PRIVATE HEALTH INSURANCE | Source: Ambulatory Visit | Attending: Family Medicine | Admitting: Family Medicine

## 2012-02-28 DIAGNOSIS — M545 Low back pain: Secondary | ICD-10-CM

## 2012-02-28 DIAGNOSIS — M79605 Pain in left leg: Secondary | ICD-10-CM

## 2012-04-24 ENCOUNTER — Other Ambulatory Visit: Payer: Self-pay

## 2012-04-24 ENCOUNTER — Other Ambulatory Visit (INDEPENDENT_AMBULATORY_CARE_PROVIDER_SITE_OTHER): Payer: PRIVATE HEALTH INSURANCE

## 2012-04-24 LAB — HEMOGLOBIN: Hemoglobin: 14.9 g/dL (ref 13.0–17.0)

## 2012-04-24 LAB — HEMATOCRIT: HCT: 43 % (ref 39.0–52.0)

## 2012-04-24 NOTE — Progress Notes (Signed)
Quick Note:  Labs ok Continue every other month phlebotomy ______

## 2012-06-12 ENCOUNTER — Other Ambulatory Visit (INDEPENDENT_AMBULATORY_CARE_PROVIDER_SITE_OTHER): Payer: PRIVATE HEALTH INSURANCE

## 2012-06-12 LAB — HEMATOCRIT: HCT: 42.8 % (ref 39.0–52.0)

## 2012-06-15 NOTE — Progress Notes (Signed)
Quick Note:  Continue every other month phlebotomy Do not order ferritin again until I ask for it  thanks ______

## 2012-08-26 ENCOUNTER — Other Ambulatory Visit (INDEPENDENT_AMBULATORY_CARE_PROVIDER_SITE_OTHER): Payer: PRIVATE HEALTH INSURANCE

## 2012-08-26 LAB — HEMATOCRIT: HCT: 46.7 % (ref 39.0–52.0)

## 2012-08-28 NOTE — Progress Notes (Signed)
Quick Note:  Continue every other month phlebotomy ______ 

## 2012-11-27 ENCOUNTER — Other Ambulatory Visit (INDEPENDENT_AMBULATORY_CARE_PROVIDER_SITE_OTHER): Payer: PRIVATE HEALTH INSURANCE

## 2012-11-27 LAB — HEMATOCRIT: HCT: 44.2 % (ref 39.0–52.0)

## 2012-11-27 LAB — HEMOGLOBIN: Hemoglobin: 15.4 g/dL (ref 13.0–17.0)

## 2012-12-01 NOTE — Progress Notes (Signed)
Quick Note:  Continue every other month phlebotomy ______ 

## 2013-01-20 ENCOUNTER — Other Ambulatory Visit (INDEPENDENT_AMBULATORY_CARE_PROVIDER_SITE_OTHER): Payer: PRIVATE HEALTH INSURANCE

## 2013-01-20 LAB — HEMOGLOBIN: Hemoglobin: 15.2 g/dL (ref 13.0–17.0)

## 2013-01-20 LAB — HEMATOCRIT: HCT: 43.7 % (ref 39.0–52.0)

## 2013-01-25 ENCOUNTER — Other Ambulatory Visit: Payer: Self-pay

## 2013-01-25 NOTE — Progress Notes (Signed)
Quick Note:  Continue every other month phlebotomy  Check a ferritin in May ______

## 2013-03-17 ENCOUNTER — Other Ambulatory Visit (INDEPENDENT_AMBULATORY_CARE_PROVIDER_SITE_OTHER): Payer: PRIVATE HEALTH INSURANCE

## 2013-03-17 LAB — HEMOGLOBIN: Hemoglobin: 16.7 g/dL (ref 13.0–17.0)

## 2013-03-17 LAB — FERRITIN: Ferritin: 29.7 ng/mL (ref 22.0–322.0)

## 2013-03-18 NOTE — Progress Notes (Signed)
Quick Note:  Things are right where they should be  Continue w/ current phlebotomy interval Do not check ferritin until ordered again  ______

## 2013-04-05 ENCOUNTER — Other Ambulatory Visit: Payer: Self-pay | Admitting: Family Medicine

## 2013-04-05 ENCOUNTER — Ambulatory Visit
Admission: RE | Admit: 2013-04-05 | Discharge: 2013-04-05 | Disposition: A | Discharge status: Home / Self Care | Payer: BC Managed Care – PPO | Source: Ambulatory Visit | Attending: Family Medicine | Admitting: Family Medicine

## 2013-04-05 DIAGNOSIS — R509 Fever, unspecified: Secondary | ICD-10-CM

## 2013-04-05 DIAGNOSIS — R05 Cough: Secondary | ICD-10-CM

## 2013-05-18 ENCOUNTER — Other Ambulatory Visit (INDEPENDENT_AMBULATORY_CARE_PROVIDER_SITE_OTHER): Payer: BC Managed Care – PPO

## 2013-05-18 ENCOUNTER — Other Ambulatory Visit: Payer: Self-pay

## 2013-05-18 LAB — HEMOGLOBIN: Hemoglobin: 14.7 g/dL (ref 13.0–17.0)

## 2013-05-18 NOTE — Progress Notes (Signed)
Quick Note:  Continue current phlebotomy schedule ______

## 2013-07-21 ENCOUNTER — Other Ambulatory Visit (INDEPENDENT_AMBULATORY_CARE_PROVIDER_SITE_OTHER): Payer: BC Managed Care – PPO

## 2013-07-21 LAB — HEMATOCRIT: HCT: 43.8 % (ref 39.0–52.0)

## 2013-07-21 LAB — HEMOGLOBIN: Hemoglobin: 15 g/dL (ref 13.0–17.0)

## 2013-07-22 NOTE — Progress Notes (Signed)
Quick Note:  Continue current phlebotomy regimen ______ 

## 2013-09-10 ENCOUNTER — Other Ambulatory Visit (INDEPENDENT_AMBULATORY_CARE_PROVIDER_SITE_OTHER): Payer: BC Managed Care – PPO

## 2013-09-15 ENCOUNTER — Other Ambulatory Visit: Payer: Self-pay

## 2013-09-15 NOTE — Progress Notes (Signed)
Quick Note:  Continue every other month phlebotomy Check ferritin next time (Jan 2015) ______

## 2013-11-18 ENCOUNTER — Other Ambulatory Visit (INDEPENDENT_AMBULATORY_CARE_PROVIDER_SITE_OTHER): Payer: BC Managed Care – PPO

## 2013-11-18 LAB — FERRITIN: Ferritin: 39.8 ng/mL (ref 22.0–322.0)

## 2013-11-18 LAB — HEMATOCRIT: HEMATOCRIT: 41.9 % (ref 39.0–52.0)

## 2013-11-18 LAB — HEMOGLOBIN: Hemoglobin: 14.9 g/dL (ref 13.0–17.0)

## 2013-11-18 NOTE — Progress Notes (Signed)
Quick Note:  Continue every other month phlebotomy ______ 

## 2014-02-16 ENCOUNTER — Other Ambulatory Visit: Payer: Self-pay

## 2014-02-16 ENCOUNTER — Other Ambulatory Visit (INDEPENDENT_AMBULATORY_CARE_PROVIDER_SITE_OTHER): Payer: BC Managed Care – PPO

## 2014-02-16 LAB — HEMATOCRIT: HCT: 41.1 % (ref 39.0–52.0)

## 2014-02-16 LAB — HEMOGLOBIN: Hemoglobin: 14.4 g/dL (ref 13.0–17.0)

## 2014-02-16 NOTE — Progress Notes (Signed)
Quick Note:  Continue every other month phlebotomy Do a ferritin next time ______

## 2014-03-02 ENCOUNTER — Other Ambulatory Visit (INDEPENDENT_AMBULATORY_CARE_PROVIDER_SITE_OTHER): Payer: BC Managed Care – PPO

## 2014-03-02 ENCOUNTER — Other Ambulatory Visit: Payer: Self-pay | Admitting: Internal Medicine

## 2014-03-02 LAB — FERRITIN: FERRITIN: 51.3 ng/mL (ref 22.0–322.0)

## 2014-03-02 LAB — HEMATOCRIT: HCT: 43.6 % (ref 39.0–52.0)

## 2014-03-02 LAB — HEMOGLOBIN: Hemoglobin: 15.2 g/dL (ref 13.0–17.0)

## 2014-03-02 NOTE — Progress Notes (Signed)
Quick Note:  Labs done early? Ferritin was 51 so needs phlebotomy every 6 weeks now ______

## 2014-03-03 ENCOUNTER — Other Ambulatory Visit: Payer: Self-pay

## 2014-04-25 ENCOUNTER — Other Ambulatory Visit (INDEPENDENT_AMBULATORY_CARE_PROVIDER_SITE_OTHER): Payer: BC Managed Care – PPO

## 2014-04-25 LAB — CBC WITH DIFFERENTIAL/PLATELET
Basophils Absolute: 0 10*3/uL (ref 0.0–0.1)
Basophils Relative: 0.5 % (ref 0.0–3.0)
EOS ABS: 0.2 10*3/uL (ref 0.0–0.7)
Eosinophils Relative: 2.8 % (ref 0.0–5.0)
HCT: 41.7 % (ref 39.0–52.0)
Hemoglobin: 14.6 g/dL (ref 13.0–17.0)
Lymphocytes Relative: 23 % (ref 12.0–46.0)
Lymphs Abs: 1.3 10*3/uL (ref 0.7–4.0)
MCHC: 34.9 g/dL (ref 30.0–36.0)
MCV: 95.2 fl (ref 78.0–100.0)
MONO ABS: 0.5 10*3/uL (ref 0.1–1.0)
Monocytes Relative: 9.9 % (ref 3.0–12.0)
Neutro Abs: 3.5 10*3/uL (ref 1.4–7.7)
Neutrophils Relative %: 63.8 % (ref 43.0–77.0)
PLATELETS: 230 10*3/uL (ref 150.0–400.0)
RBC: 4.38 Mil/uL (ref 4.22–5.81)
RDW: 12.6 % (ref 11.5–15.5)
WBC: 5.5 10*3/uL (ref 4.0–10.5)

## 2014-04-25 LAB — HEMATOCRIT: HEMATOCRIT: 41.7 % (ref 39.0–52.0)

## 2014-04-26 NOTE — Progress Notes (Signed)
Quick Note:  Continue phlebotomy every 6 weeks as before ______

## 2014-05-25 ENCOUNTER — Other Ambulatory Visit (INDEPENDENT_AMBULATORY_CARE_PROVIDER_SITE_OTHER): Payer: BC Managed Care – PPO

## 2014-05-25 LAB — CBC WITH DIFFERENTIAL/PLATELET
Basophils Absolute: 0 10*3/uL (ref 0.0–0.1)
Basophils Relative: 0.4 % (ref 0.0–3.0)
Eosinophils Absolute: 0.1 10*3/uL (ref 0.0–0.7)
Eosinophils Relative: 2.3 % (ref 0.0–5.0)
HCT: 42.1 % (ref 39.0–52.0)
Hemoglobin: 14.6 g/dL (ref 13.0–17.0)
Lymphocytes Relative: 27.7 % (ref 12.0–46.0)
Lymphs Abs: 1.6 10*3/uL (ref 0.7–4.0)
MCHC: 34.6 g/dL (ref 30.0–36.0)
MCV: 93.9 fl (ref 78.0–100.0)
MONO ABS: 0.7 10*3/uL (ref 0.1–1.0)
MONOS PCT: 11.6 % (ref 3.0–12.0)
NEUTROS PCT: 58 % (ref 43.0–77.0)
Neutro Abs: 3.3 10*3/uL (ref 1.4–7.7)
PLATELETS: 238 10*3/uL (ref 150.0–400.0)
RBC: 4.48 Mil/uL (ref 4.22–5.81)
RDW: 12.4 % (ref 11.5–15.5)
WBC: 5.7 10*3/uL (ref 4.0–10.5)

## 2014-05-26 NOTE — Progress Notes (Signed)
Quick Note:  Continue phlebotomy every 6 weeks ______

## 2014-07-14 ENCOUNTER — Other Ambulatory Visit (INDEPENDENT_AMBULATORY_CARE_PROVIDER_SITE_OTHER): Payer: BC Managed Care – PPO

## 2014-07-14 LAB — CBC WITH DIFFERENTIAL/PLATELET
BASOS PCT: 0.4 % (ref 0.0–3.0)
Basophils Absolute: 0 10*3/uL (ref 0.0–0.1)
EOS PCT: 2.8 % (ref 0.0–5.0)
Eosinophils Absolute: 0.1 10*3/uL (ref 0.0–0.7)
HCT: 43.7 % (ref 39.0–52.0)
Hemoglobin: 15.2 g/dL (ref 13.0–17.0)
Lymphocytes Relative: 29 % (ref 12.0–46.0)
Lymphs Abs: 1.4 10*3/uL (ref 0.7–4.0)
MCHC: 34.7 g/dL (ref 30.0–36.0)
MCV: 94.4 fl (ref 78.0–100.0)
MONO ABS: 0.5 10*3/uL (ref 0.1–1.0)
MONOS PCT: 10.1 % (ref 3.0–12.0)
NEUTROS PCT: 57.7 % (ref 43.0–77.0)
Neutro Abs: 2.8 10*3/uL (ref 1.4–7.7)
Platelets: 223 10*3/uL (ref 150.0–400.0)
RBC: 4.63 Mil/uL (ref 4.22–5.81)
RDW: 13 % (ref 11.5–15.5)
WBC: 4.8 10*3/uL (ref 4.0–10.5)

## 2014-07-14 LAB — HEMATOCRIT: HEMATOCRIT: 43.7 % (ref 39.0–52.0)

## 2014-07-20 NOTE — Progress Notes (Signed)
Quick Note:  Continue phlebotomy every 6 weeks ______

## 2014-08-18 ENCOUNTER — Other Ambulatory Visit: Payer: BC Managed Care – PPO

## 2014-08-23 ENCOUNTER — Other Ambulatory Visit (INDEPENDENT_AMBULATORY_CARE_PROVIDER_SITE_OTHER): Payer: BC Managed Care – PPO

## 2014-08-23 LAB — CBC WITH DIFFERENTIAL/PLATELET
BASOS PCT: 0.5 % (ref 0.0–3.0)
Basophils Absolute: 0 10*3/uL (ref 0.0–0.1)
EOS ABS: 0.2 10*3/uL (ref 0.0–0.7)
Eosinophils Relative: 2.4 % (ref 0.0–5.0)
HCT: 44.1 % (ref 39.0–52.0)
Hemoglobin: 15.1 g/dL (ref 13.0–17.0)
Lymphocytes Relative: 27.1 % (ref 12.0–46.0)
Lymphs Abs: 1.7 10*3/uL (ref 0.7–4.0)
MCHC: 34.2 g/dL (ref 30.0–36.0)
MCV: 95.6 fl (ref 78.0–100.0)
MONO ABS: 0.7 10*3/uL (ref 0.1–1.0)
Monocytes Relative: 10.8 % (ref 3.0–12.0)
NEUTROS PCT: 59.2 % (ref 43.0–77.0)
Neutro Abs: 3.8 10*3/uL (ref 1.4–7.7)
Platelets: 233 10*3/uL (ref 150.0–400.0)
RBC: 4.61 Mil/uL (ref 4.22–5.81)
RDW: 12.9 % (ref 11.5–15.5)
WBC: 6.4 10*3/uL (ref 4.0–10.5)

## 2014-08-24 ENCOUNTER — Other Ambulatory Visit: Payer: Self-pay

## 2014-08-24 NOTE — Progress Notes (Signed)
Quick Note:  Phlebotomy again in 6 weeks Do ferritin then please ______

## 2014-10-06 ENCOUNTER — Other Ambulatory Visit (INDEPENDENT_AMBULATORY_CARE_PROVIDER_SITE_OTHER): Payer: BC Managed Care – PPO

## 2014-10-06 LAB — CBC WITH DIFFERENTIAL/PLATELET
BASOS ABS: 0 10*3/uL (ref 0.0–0.1)
BASOS PCT: 0.8 % (ref 0.0–3.0)
EOS ABS: 0.2 10*3/uL (ref 0.0–0.7)
Eosinophils Relative: 3.1 % (ref 0.0–5.0)
HCT: 42.4 % (ref 39.0–52.0)
Hemoglobin: 14.5 g/dL (ref 13.0–17.0)
LYMPHS PCT: 30.8 % (ref 12.0–46.0)
Lymphs Abs: 1.6 10*3/uL (ref 0.7–4.0)
MCHC: 34.1 g/dL (ref 30.0–36.0)
MCV: 92.4 fl (ref 78.0–100.0)
Monocytes Absolute: 0.7 10*3/uL (ref 0.1–1.0)
Monocytes Relative: 12.2 % — ABNORMAL HIGH (ref 3.0–12.0)
NEUTROS PCT: 53.1 % (ref 43.0–77.0)
Neutro Abs: 2.8 10*3/uL (ref 1.4–7.7)
Platelets: 242 10*3/uL (ref 150.0–400.0)
RBC: 4.59 Mil/uL (ref 4.22–5.81)
RDW: 12.3 % (ref 11.5–15.5)
WBC: 5.3 10*3/uL (ref 4.0–10.5)

## 2014-10-06 LAB — FERRITIN: FERRITIN: 16.8 ng/mL — AB (ref 22.0–322.0)

## 2014-10-06 LAB — HEMATOCRIT: HCT: 42.4 % (ref 39.0–52.0)

## 2014-10-06 NOTE — Progress Notes (Signed)
Quick Note:  Phlebotomy every 2 months ______

## 2014-11-25 ENCOUNTER — Other Ambulatory Visit (INDEPENDENT_AMBULATORY_CARE_PROVIDER_SITE_OTHER): Payer: Self-pay

## 2014-11-25 LAB — CBC WITH DIFFERENTIAL/PLATELET
BASOS ABS: 0 10*3/uL (ref 0.0–0.1)
Basophils Relative: 0.7 % (ref 0.0–3.0)
Eosinophils Absolute: 0.2 10*3/uL (ref 0.0–0.7)
Eosinophils Relative: 4.2 % (ref 0.0–5.0)
HEMATOCRIT: 41.9 % (ref 39.0–52.0)
HEMOGLOBIN: 14.7 g/dL (ref 13.0–17.0)
LYMPHS ABS: 1.2 10*3/uL (ref 0.7–4.0)
Lymphocytes Relative: 26.1 % (ref 12.0–46.0)
MCHC: 35 g/dL (ref 30.0–36.0)
MCV: 88.9 fl (ref 78.0–100.0)
Monocytes Absolute: 0.5 10*3/uL (ref 0.1–1.0)
Monocytes Relative: 10.2 % (ref 3.0–12.0)
NEUTROS PCT: 58.8 % (ref 43.0–77.0)
Neutro Abs: 2.7 10*3/uL (ref 1.4–7.7)
Platelets: 218 10*3/uL (ref 150.0–400.0)
RBC: 4.72 Mil/uL (ref 4.22–5.81)
RDW: 12.6 % (ref 11.5–15.5)
WBC: 4.6 10*3/uL (ref 4.0–10.5)

## 2014-11-25 LAB — HEMATOCRIT: HEMATOCRIT: 41.9 % (ref 39.0–52.0)

## 2014-11-28 NOTE — Progress Notes (Signed)
Quick Note:  Continue q 2 month phlebotomy ______

## 2014-12-29 ENCOUNTER — Ambulatory Visit: Payer: Self-pay

## 2015-01-03 ENCOUNTER — Other Ambulatory Visit (INDEPENDENT_AMBULATORY_CARE_PROVIDER_SITE_OTHER): Payer: Self-pay

## 2015-01-03 LAB — CBC WITH DIFFERENTIAL/PLATELET
BASOS PCT: 0.6 % (ref 0.0–3.0)
Basophils Absolute: 0 10*3/uL (ref 0.0–0.1)
EOS PCT: 3.8 % (ref 0.0–5.0)
Eosinophils Absolute: 0.2 10*3/uL (ref 0.0–0.7)
HEMATOCRIT: 40.2 % (ref 39.0–52.0)
Hemoglobin: 13.7 g/dL (ref 13.0–17.0)
Lymphocytes Relative: 30.1 % (ref 12.0–46.0)
Lymphs Abs: 1.4 10*3/uL (ref 0.7–4.0)
MCHC: 34 g/dL (ref 30.0–36.0)
MCV: 89.3 fl (ref 78.0–100.0)
MONO ABS: 0.4 10*3/uL (ref 0.1–1.0)
MONOS PCT: 8.9 % (ref 3.0–12.0)
NEUTROS ABS: 2.6 10*3/uL (ref 1.4–7.7)
Neutrophils Relative %: 56.6 % (ref 43.0–77.0)
Platelets: 254 10*3/uL (ref 150.0–400.0)
RBC: 4.5 Mil/uL (ref 4.22–5.81)
RDW: 13.6 % (ref 11.5–15.5)
WBC: 4.7 10*3/uL (ref 4.0–10.5)

## 2015-01-03 LAB — HEMATOCRIT: HCT: 40.2 % (ref 39.0–52.0)

## 2015-01-05 ENCOUNTER — Ambulatory Visit: Payer: Self-pay

## 2015-01-05 NOTE — Progress Notes (Signed)
Quick Note:  Continue q 2 mo phlebotomy ______

## 2015-01-12 ENCOUNTER — Ambulatory Visit: Payer: Self-pay

## 2015-02-20 ENCOUNTER — Other Ambulatory Visit (INDEPENDENT_AMBULATORY_CARE_PROVIDER_SITE_OTHER): Payer: Self-pay

## 2015-02-20 LAB — CBC WITH DIFFERENTIAL/PLATELET
Basophils Absolute: 0 10*3/uL (ref 0.0–0.1)
Basophils Relative: 0.7 % (ref 0.0–3.0)
Eosinophils Absolute: 0.3 10*3/uL (ref 0.0–0.7)
Eosinophils Relative: 5.5 % — ABNORMAL HIGH (ref 0.0–5.0)
HCT: 39.6 % (ref 39.0–52.0)
Hemoglobin: 13.3 g/dL (ref 13.0–17.0)
LYMPHS ABS: 1.3 10*3/uL (ref 0.7–4.0)
Lymphocytes Relative: 23 % (ref 12.0–46.0)
MCHC: 33.7 g/dL (ref 30.0–36.0)
MCV: 89.3 fl (ref 78.0–100.0)
MONO ABS: 0.4 10*3/uL (ref 0.1–1.0)
MONOS PCT: 6.6 % (ref 3.0–12.0)
NEUTROS ABS: 3.5 10*3/uL (ref 1.4–7.7)
Neutrophils Relative %: 64.2 % (ref 43.0–77.0)
Platelets: 244 10*3/uL (ref 150.0–400.0)
RBC: 4.43 Mil/uL (ref 4.22–5.81)
RDW: 14.5 % (ref 11.5–15.5)
WBC: 5.5 10*3/uL (ref 4.0–10.5)

## 2015-02-20 LAB — HEMATOCRIT: HCT: 39.6 % (ref 39.0–52.0)

## 2015-02-23 NOTE — Progress Notes (Signed)
Quick Note:  Please continue q 2 month phlebotomy Check a ferritin next time ______

## 2015-02-24 ENCOUNTER — Other Ambulatory Visit: Payer: Self-pay

## 2015-03-31 ENCOUNTER — Other Ambulatory Visit: Payer: Self-pay

## 2015-06-16 ENCOUNTER — Other Ambulatory Visit (INDEPENDENT_AMBULATORY_CARE_PROVIDER_SITE_OTHER): Payer: Self-pay

## 2015-06-16 LAB — CBC WITH DIFFERENTIAL/PLATELET
BASOS ABS: 0 10*3/uL (ref 0.0–0.1)
Basophils Relative: 0.8 % (ref 0.0–3.0)
EOS PCT: 5 % (ref 0.0–5.0)
Eosinophils Absolute: 0.3 10*3/uL (ref 0.0–0.7)
HCT: 41.3 % (ref 39.0–52.0)
HEMOGLOBIN: 14 g/dL (ref 13.0–17.0)
Lymphocytes Relative: 25.9 % (ref 12.0–46.0)
Lymphs Abs: 1.4 10*3/uL (ref 0.7–4.0)
MCHC: 34 g/dL (ref 30.0–36.0)
MCV: 92.6 fl (ref 78.0–100.0)
MONO ABS: 0.5 10*3/uL (ref 0.1–1.0)
Monocytes Relative: 9.3 % (ref 3.0–12.0)
NEUTROS PCT: 59 % (ref 43.0–77.0)
Neutro Abs: 3.2 10*3/uL (ref 1.4–7.7)
Platelets: 228 10*3/uL (ref 150.0–400.0)
RBC: 4.46 Mil/uL (ref 4.22–5.81)
RDW: 15.4 % (ref 11.5–15.5)
WBC: 5.4 10*3/uL (ref 4.0–10.5)

## 2015-06-16 LAB — FERRITIN: Ferritin: 15 ng/mL — ABNORMAL LOW (ref 22.0–322.0)

## 2015-06-20 NOTE — Progress Notes (Signed)
Quick Note:  Continue q 2 month phlebotomy ______

## 2015-08-17 ENCOUNTER — Other Ambulatory Visit (INDEPENDENT_AMBULATORY_CARE_PROVIDER_SITE_OTHER): Payer: Self-pay

## 2015-08-17 LAB — CBC WITH DIFFERENTIAL/PLATELET
BASOS ABS: 0 10*3/uL (ref 0.0–0.1)
Basophils Relative: 0.7 % (ref 0.0–3.0)
EOS ABS: 0.3 10*3/uL (ref 0.0–0.7)
Eosinophils Relative: 7.5 % — ABNORMAL HIGH (ref 0.0–5.0)
HCT: 40 % (ref 39.0–52.0)
Hemoglobin: 13.3 g/dL (ref 13.0–17.0)
Lymphocytes Relative: 28.9 % (ref 12.0–46.0)
Lymphs Abs: 1.2 10*3/uL (ref 0.7–4.0)
MCHC: 33.3 g/dL (ref 30.0–36.0)
MCV: 95 fl (ref 78.0–100.0)
MONO ABS: 0.4 10*3/uL (ref 0.1–1.0)
Monocytes Relative: 9 % (ref 3.0–12.0)
NEUTROS ABS: 2.3 10*3/uL (ref 1.4–7.7)
Neutrophils Relative %: 53.9 % (ref 43.0–77.0)
PLATELETS: 202 10*3/uL (ref 150.0–400.0)
RBC: 4.21 Mil/uL — ABNORMAL LOW (ref 4.22–5.81)
RDW: 13.4 % (ref 11.5–15.5)
WBC: 4.2 10*3/uL (ref 4.0–10.5)

## 2015-08-18 NOTE — Progress Notes (Signed)
Quick Note:  Repeat phlebotomy 2 months please ______

## 2015-10-13 ENCOUNTER — Other Ambulatory Visit (INDEPENDENT_AMBULATORY_CARE_PROVIDER_SITE_OTHER): Payer: Self-pay

## 2015-10-13 LAB — CBC WITH DIFFERENTIAL/PLATELET
BASOS PCT: 0.4 % (ref 0.0–3.0)
Basophils Absolute: 0 10*3/uL (ref 0.0–0.1)
Eosinophils Absolute: 0.3 10*3/uL (ref 0.0–0.7)
Eosinophils Relative: 4.8 % (ref 0.0–5.0)
HCT: 39.8 % (ref 39.0–52.0)
Hemoglobin: 13.3 g/dL (ref 13.0–17.0)
Lymphocytes Relative: 23 % (ref 12.0–46.0)
Lymphs Abs: 1.4 10*3/uL (ref 0.7–4.0)
MCHC: 33.3 g/dL (ref 30.0–36.0)
MCV: 91.9 fl (ref 78.0–100.0)
MONOS PCT: 9.1 % (ref 3.0–12.0)
Monocytes Absolute: 0.5 10*3/uL (ref 0.1–1.0)
Neutro Abs: 3.7 10*3/uL (ref 1.4–7.7)
Neutrophils Relative %: 62.7 % (ref 43.0–77.0)
Platelets: 267 10*3/uL (ref 150.0–400.0)
RBC: 4.34 Mil/uL (ref 4.22–5.81)
RDW: 13.1 % (ref 11.5–15.5)
WBC: 5.9 10*3/uL (ref 4.0–10.5)

## 2015-10-14 NOTE — Progress Notes (Signed)
Quick Note:  Repeat phlebotomy in 2 months please ______ 

## 2015-12-11 ENCOUNTER — Ambulatory Visit
Admission: RE | Admit: 2015-12-11 | Discharge: 2015-12-11 | Disposition: A | Discharge status: Home / Self Care | Payer: BLUE CROSS/BLUE SHIELD | Source: Ambulatory Visit | Attending: Family Medicine | Admitting: Family Medicine

## 2015-12-11 ENCOUNTER — Other Ambulatory Visit: Payer: Self-pay | Admitting: Family Medicine

## 2015-12-11 DIAGNOSIS — G8929 Other chronic pain: Secondary | ICD-10-CM

## 2015-12-11 DIAGNOSIS — M545 Low back pain, unspecified: Secondary | ICD-10-CM

## 2015-12-12 ENCOUNTER — Other Ambulatory Visit: Payer: Self-pay | Admitting: Family Medicine

## 2015-12-12 DIAGNOSIS — M545 Low back pain: Secondary | ICD-10-CM

## 2015-12-12 DIAGNOSIS — M79606 Pain in leg, unspecified: Secondary | ICD-10-CM

## 2015-12-12 DIAGNOSIS — Z77018 Contact with and (suspected) exposure to other hazardous metals: Secondary | ICD-10-CM

## 2015-12-20 ENCOUNTER — Ambulatory Visit
Admission: RE | Admit: 2015-12-20 | Discharge: 2015-12-20 | Disposition: A | Discharge status: Home / Self Care | Payer: BLUE CROSS/BLUE SHIELD | Source: Ambulatory Visit | Attending: Family Medicine | Admitting: Family Medicine

## 2015-12-20 ENCOUNTER — Other Ambulatory Visit (INDEPENDENT_AMBULATORY_CARE_PROVIDER_SITE_OTHER): Payer: BLUE CROSS/BLUE SHIELD

## 2015-12-20 DIAGNOSIS — Z77018 Contact with and (suspected) exposure to other hazardous metals: Secondary | ICD-10-CM

## 2015-12-20 DIAGNOSIS — M79606 Pain in leg, unspecified: Secondary | ICD-10-CM

## 2015-12-20 DIAGNOSIS — M545 Low back pain: Secondary | ICD-10-CM

## 2015-12-20 LAB — CBC WITH DIFFERENTIAL/PLATELET
BASOS ABS: 0 10*3/uL (ref 0.0–0.1)
BASOS PCT: 0.1 % (ref 0.0–3.0)
EOS ABS: 0.1 10*3/uL (ref 0.0–0.7)
Eosinophils Relative: 0.8 % (ref 0.0–5.0)
HEMATOCRIT: 42.2 % (ref 39.0–52.0)
HEMOGLOBIN: 14.5 g/dL (ref 13.0–17.0)
LYMPHS PCT: 20.6 % (ref 12.0–46.0)
Lymphs Abs: 1.8 10*3/uL (ref 0.7–4.0)
MCHC: 34.3 g/dL (ref 30.0–36.0)
MCV: 89.3 fl (ref 78.0–100.0)
MONOS PCT: 11.4 % (ref 3.0–12.0)
Monocytes Absolute: 1 10*3/uL (ref 0.1–1.0)
Neutro Abs: 5.9 10*3/uL (ref 1.4–7.7)
Neutrophils Relative %: 67.1 % (ref 43.0–77.0)
Platelets: 288 10*3/uL (ref 150.0–400.0)
RBC: 4.72 Mil/uL (ref 4.22–5.81)
RDW: 14.1 % (ref 11.5–15.5)
WBC: 8.8 10*3/uL (ref 4.0–10.5)

## 2015-12-21 ENCOUNTER — Other Ambulatory Visit: Payer: Self-pay

## 2015-12-21 NOTE — Progress Notes (Signed)
Quick Note:  Repeat phlebotomy in 2 months and also do a ferritin ______

## 2015-12-25 ENCOUNTER — Other Ambulatory Visit: Payer: Self-pay | Admitting: Family Medicine

## 2015-12-25 DIAGNOSIS — M545 Low back pain: Principal | ICD-10-CM

## 2015-12-25 DIAGNOSIS — G8929 Other chronic pain: Secondary | ICD-10-CM

## 2015-12-27 ENCOUNTER — Ambulatory Visit
Admission: RE | Admit: 2015-12-27 | Discharge: 2015-12-27 | Disposition: A | Discharge status: Home / Self Care | Payer: BLUE CROSS/BLUE SHIELD | Source: Ambulatory Visit | Attending: Family Medicine | Admitting: Family Medicine

## 2015-12-27 DIAGNOSIS — G8929 Other chronic pain: Secondary | ICD-10-CM

## 2015-12-27 DIAGNOSIS — M545 Low back pain, unspecified: Secondary | ICD-10-CM

## 2015-12-27 HISTORY — DX: Hemochromatosis, unspecified: E83.119

## 2015-12-27 HISTORY — DX: Disease of blood and blood-forming organs, unspecified: D75.9

## 2015-12-27 MED ORDER — IOHEXOL 180 MG/ML  SOLN
1.0000 mL | Freq: Once | INTRAMUSCULAR | Status: AC | PRN
  Administered 2015-12-27: 1 mL via EPIDURAL

## 2015-12-27 MED ORDER — METHYLPREDNISOLONE ACETATE 40 MG/ML INJ SUSP (RADIOLOG
120.0000 mg | Freq: Once | INTRAMUSCULAR | Status: AC
  Administered 2015-12-27: 120 mg via EPIDURAL

## 2015-12-27 NOTE — Discharge Instructions (Signed)

## 2016-01-15 ENCOUNTER — Ambulatory Visit
Admission: RE | Admit: 2016-01-15 | Discharge: 2016-01-15 | Disposition: A | Discharge status: Home / Self Care | Payer: BLUE CROSS/BLUE SHIELD | Source: Ambulatory Visit | Attending: Family Medicine | Admitting: Family Medicine

## 2016-01-15 ENCOUNTER — Other Ambulatory Visit: Payer: Self-pay | Admitting: Family Medicine

## 2016-01-15 DIAGNOSIS — R05 Cough: Secondary | ICD-10-CM

## 2016-01-15 DIAGNOSIS — R059 Cough, unspecified: Secondary | ICD-10-CM

## 2016-01-15 DIAGNOSIS — R062 Wheezing: Secondary | ICD-10-CM

## 2016-02-26 ENCOUNTER — Other Ambulatory Visit (INDEPENDENT_AMBULATORY_CARE_PROVIDER_SITE_OTHER): Payer: BLUE CROSS/BLUE SHIELD

## 2016-02-26 LAB — CBC WITH DIFFERENTIAL/PLATELET
BASOS ABS: 0 10*3/uL (ref 0.0–0.1)
Basophils Relative: 0.4 % (ref 0.0–3.0)
EOS PCT: 1.4 % (ref 0.0–5.0)
Eosinophils Absolute: 0.1 10*3/uL (ref 0.0–0.7)
HEMATOCRIT: 40.3 % (ref 39.0–52.0)
Hemoglobin: 14 g/dL (ref 13.0–17.0)
LYMPHS PCT: 36.3 % (ref 12.0–46.0)
Lymphs Abs: 1.7 10*3/uL (ref 0.7–4.0)
MCHC: 34.9 g/dL (ref 30.0–36.0)
MCV: 93.1 fl (ref 78.0–100.0)
MONOS PCT: 8.6 % (ref 3.0–12.0)
Monocytes Absolute: 0.4 10*3/uL (ref 0.1–1.0)
NEUTROS ABS: 2.5 10*3/uL (ref 1.4–7.7)
Neutrophils Relative %: 53.3 % (ref 43.0–77.0)
Platelets: 228 10*3/uL (ref 150.0–400.0)
RBC: 4.33 Mil/uL (ref 4.22–5.81)
RDW: 15.4 % (ref 11.5–15.5)
WBC: 4.8 10*3/uL (ref 4.0–10.5)

## 2016-02-26 LAB — FERRITIN: Ferritin: 27.2 ng/mL (ref 22.0–322.0)

## 2016-02-26 NOTE — Progress Notes (Signed)
Quick Note:  Continue every 2 month phlebotomy ______

## 2016-03-07 ENCOUNTER — Other Ambulatory Visit: Payer: Self-pay | Admitting: Family Medicine

## 2016-03-07 DIAGNOSIS — G8929 Other chronic pain: Secondary | ICD-10-CM

## 2016-03-07 DIAGNOSIS — M545 Low back pain: Principal | ICD-10-CM

## 2016-03-13 ENCOUNTER — Ambulatory Visit
Admission: RE | Admit: 2016-03-13 | Discharge: 2016-03-13 | Disposition: A | Discharge status: Home / Self Care | Payer: BLUE CROSS/BLUE SHIELD | Source: Ambulatory Visit | Attending: Family Medicine | Admitting: Family Medicine

## 2016-03-13 DIAGNOSIS — M545 Low back pain: Principal | ICD-10-CM

## 2016-03-13 DIAGNOSIS — G8929 Other chronic pain: Secondary | ICD-10-CM

## 2016-03-13 MED ORDER — METHYLPREDNISOLONE ACETATE 40 MG/ML INJ SUSP (RADIOLOG
120.0000 mg | Freq: Once | INTRAMUSCULAR | Status: AC
  Administered 2016-03-13: 120 mg via EPIDURAL

## 2016-03-13 MED ORDER — IOPAMIDOL (ISOVUE-M 200) INJECTION 41%
1.0000 mL | Freq: Once | INTRAMUSCULAR | Status: AC
  Administered 2016-03-13: 1 mL via EPIDURAL

## 2016-03-13 NOTE — Discharge Instructions (Signed)

## 2016-04-15 ENCOUNTER — Telehealth: Payer: Self-pay

## 2016-04-15 ENCOUNTER — Other Ambulatory Visit (INDEPENDENT_AMBULATORY_CARE_PROVIDER_SITE_OTHER): Payer: BLUE CROSS/BLUE SHIELD

## 2016-04-15 LAB — CBC WITH DIFFERENTIAL/PLATELET
Basophils Absolute: 0 K/uL (ref 0.0–0.1)
Basophils Relative: 0.6 % (ref 0.0–3.0)
Eosinophils Absolute: 0.2 K/uL (ref 0.0–0.7)
Eosinophils Relative: 3.2 % (ref 0.0–5.0)
HCT: 39.7 % (ref 39.0–52.0)
Hemoglobin: 13.8 g/dL (ref 13.0–17.0)
Lymphocytes Relative: 28.4 % (ref 12.0–46.0)
Lymphs Abs: 1.4 K/uL (ref 0.7–4.0)
MCHC: 34.8 g/dL (ref 30.0–36.0)
MCV: 96.3 fl (ref 78.0–100.0)
Monocytes Absolute: 0.4 K/uL (ref 0.1–1.0)
Monocytes Relative: 8 % (ref 3.0–12.0)
Neutro Abs: 2.8 K/uL (ref 1.4–7.7)
Neutrophils Relative %: 59.8 % (ref 43.0–77.0)
Platelets: 220 K/uL (ref 150.0–400.0)
RBC: 4.13 Mil/uL — ABNORMAL LOW (ref 4.22–5.81)
RDW: 13.5 % (ref 11.5–15.5)
WBC: 4.8 K/uL (ref 4.0–10.5)

## 2016-04-15 LAB — FERRITIN: Ferritin: 18 ng/mL — ABNORMAL LOW (ref 22.0–322.0)

## 2016-04-15 NOTE — Telephone Encounter (Signed)
Lab called for standing orders to be put in for patient's monthly phlebotomy, CBC/diff, Ferritin.

## 2016-04-15 NOTE — Progress Notes (Signed)
Quick Note:  Arrange phlebotomy for 2 months ______

## 2016-05-01 NOTE — Progress Notes (Signed)
Quick Note:  Just to clarify - repeat phlebotomy in 2 months ______

## 2016-05-02 ENCOUNTER — Telehealth: Payer: Self-pay | Admitting: Internal Medicine

## 2016-05-02 NOTE — Telephone Encounter (Signed)
See lab results notes for additional details.  

## 2016-05-20 ENCOUNTER — Other Ambulatory Visit: Payer: Self-pay | Admitting: Family Medicine

## 2016-05-20 DIAGNOSIS — M545 Low back pain, unspecified: Secondary | ICD-10-CM

## 2016-05-20 DIAGNOSIS — G8929 Other chronic pain: Secondary | ICD-10-CM

## 2016-05-24 ENCOUNTER — Other Ambulatory Visit: Payer: BLUE CROSS/BLUE SHIELD

## 2016-05-31 ENCOUNTER — Other Ambulatory Visit: Payer: Self-pay | Admitting: Family Medicine

## 2016-05-31 ENCOUNTER — Ambulatory Visit
Admission: RE | Admit: 2016-05-31 | Discharge: 2016-05-31 | Disposition: A | Discharge status: Home / Self Care | Payer: BLUE CROSS/BLUE SHIELD | Source: Ambulatory Visit | Attending: Family Medicine | Admitting: Family Medicine

## 2016-05-31 DIAGNOSIS — G8929 Other chronic pain: Secondary | ICD-10-CM

## 2016-05-31 DIAGNOSIS — M545 Low back pain, unspecified: Secondary | ICD-10-CM

## 2016-05-31 MED ORDER — METHYLPREDNISOLONE ACETATE 40 MG/ML INJ SUSP (RADIOLOG
120.0000 mg | Freq: Once | INTRAMUSCULAR | Status: AC
  Administered 2016-05-31: 120 mg via EPIDURAL

## 2016-05-31 MED ORDER — IOPAMIDOL (ISOVUE-M 200) INJECTION 41%
1.0000 mL | Freq: Once | INTRAMUSCULAR | Status: AC
  Administered 2016-05-31: 1 mL via EPIDURAL

## 2016-05-31 NOTE — Discharge Instructions (Signed)

## 2016-06-19 ENCOUNTER — Other Ambulatory Visit (INDEPENDENT_AMBULATORY_CARE_PROVIDER_SITE_OTHER): Payer: BLUE CROSS/BLUE SHIELD

## 2016-06-19 LAB — CBC WITH DIFFERENTIAL/PLATELET
BASOS PCT: 0.3 % (ref 0.0–3.0)
Basophils Absolute: 0 10*3/uL (ref 0.0–0.1)
EOS PCT: 2.6 % (ref 0.0–5.0)
Eosinophils Absolute: 0.1 10*3/uL (ref 0.0–0.7)
HCT: 41.1 % (ref 39.0–52.0)
Hemoglobin: 14.4 g/dL (ref 13.0–17.0)
LYMPHS ABS: 1.3 10*3/uL (ref 0.7–4.0)
Lymphocytes Relative: 27.2 % (ref 12.0–46.0)
MCHC: 35 g/dL (ref 30.0–36.0)
MCV: 95.4 fl (ref 78.0–100.0)
MONO ABS: 0.4 10*3/uL (ref 0.1–1.0)
MONOS PCT: 8.4 % (ref 3.0–12.0)
NEUTROS ABS: 2.9 10*3/uL (ref 1.4–7.7)
NEUTROS PCT: 61.5 % (ref 43.0–77.0)
PLATELETS: 190 10*3/uL (ref 150.0–400.0)
RBC: 4.31 Mil/uL (ref 4.22–5.81)
RDW: 12.6 % (ref 11.5–15.5)
WBC: 4.7 10*3/uL (ref 4.0–10.5)

## 2016-06-19 LAB — FERRITIN: FERRITIN: 27.9 ng/mL (ref 22.0–322.0)

## 2016-06-19 NOTE — Progress Notes (Signed)
Continue every 2 month phlebotomy

## 2016-08-22 ENCOUNTER — Other Ambulatory Visit (INDEPENDENT_AMBULATORY_CARE_PROVIDER_SITE_OTHER): Payer: BLUE CROSS/BLUE SHIELD

## 2016-08-22 LAB — CBC WITH DIFFERENTIAL/PLATELET
BASOS ABS: 0 10*3/uL (ref 0.0–0.1)
Basophils Relative: 0.4 % (ref 0.0–3.0)
EOS ABS: 0.2 10*3/uL (ref 0.0–0.7)
Eosinophils Relative: 4 % (ref 0.0–5.0)
HCT: 43.5 % (ref 39.0–52.0)
Hemoglobin: 15.3 g/dL (ref 13.0–17.0)
LYMPHS ABS: 1.5 10*3/uL (ref 0.7–4.0)
Lymphocytes Relative: 26.5 % (ref 12.0–46.0)
MCHC: 35.2 g/dL (ref 30.0–36.0)
MCV: 94.7 fl (ref 78.0–100.0)
MONO ABS: 0.6 10*3/uL (ref 0.1–1.0)
MONOS PCT: 10.5 % (ref 3.0–12.0)
NEUTROS ABS: 3.3 10*3/uL (ref 1.4–7.7)
NEUTROS PCT: 58.6 % (ref 43.0–77.0)
PLATELETS: 219 10*3/uL (ref 150.0–400.0)
RBC: 4.6 Mil/uL (ref 4.22–5.81)
RDW: 13 % (ref 11.5–15.5)
WBC: 5.7 10*3/uL (ref 4.0–10.5)

## 2016-08-26 LAB — FERRITIN: FERRITIN: 23.2 ng/mL (ref 22.0–322.0)

## 2016-08-26 NOTE — Progress Notes (Signed)
Continue current every 2 month phlebotomy

## 2016-09-04 ENCOUNTER — Encounter: Payer: Self-pay | Admitting: Internal Medicine

## 2016-10-25 ENCOUNTER — Other Ambulatory Visit (INDEPENDENT_AMBULATORY_CARE_PROVIDER_SITE_OTHER): Payer: BLUE CROSS/BLUE SHIELD

## 2016-10-25 LAB — CBC WITH DIFFERENTIAL/PLATELET
Basophils Absolute: 0 10*3/uL (ref 0.0–0.1)
Basophils Relative: 0.3 % (ref 0.0–3.0)
EOS PCT: 2 % (ref 0.0–5.0)
Eosinophils Absolute: 0.1 10*3/uL (ref 0.0–0.7)
HEMATOCRIT: 45.3 % (ref 39.0–52.0)
HEMOGLOBIN: 15.9 g/dL (ref 13.0–17.0)
Lymphocytes Relative: 25.2 % (ref 12.0–46.0)
Lymphs Abs: 1.5 10*3/uL (ref 0.7–4.0)
MCHC: 35.1 g/dL (ref 30.0–36.0)
MCV: 94.1 fl (ref 78.0–100.0)
MONOS PCT: 10.1 % (ref 3.0–12.0)
Monocytes Absolute: 0.6 10*3/uL (ref 0.1–1.0)
Neutro Abs: 3.7 10*3/uL (ref 1.4–7.7)
Neutrophils Relative %: 62.4 % (ref 43.0–77.0)
Platelets: 215 10*3/uL (ref 150.0–400.0)
RBC: 4.82 Mil/uL (ref 4.22–5.81)
RDW: 12.6 % (ref 11.5–15.5)
WBC: 6 10*3/uL (ref 4.0–10.5)

## 2016-10-25 LAB — FERRITIN: FERRITIN: 21.9 ng/mL — AB (ref 22.0–322.0)

## 2016-10-25 NOTE — Progress Notes (Signed)
Continue q 2 month phlebotomy

## 2016-11-08 ENCOUNTER — Other Ambulatory Visit: Payer: Self-pay | Admitting: Family Medicine

## 2016-11-08 DIAGNOSIS — M545 Low back pain: Secondary | ICD-10-CM

## 2016-11-08 DIAGNOSIS — M79605 Pain in left leg: Secondary | ICD-10-CM

## 2016-11-15 ENCOUNTER — Ambulatory Visit
Admission: RE | Admit: 2016-11-15 | Discharge: 2016-11-15 | Disposition: A | Discharge status: Home / Self Care | Payer: BLUE CROSS/BLUE SHIELD | Source: Ambulatory Visit | Attending: Family Medicine | Admitting: Family Medicine

## 2016-11-15 ENCOUNTER — Other Ambulatory Visit: Payer: Self-pay | Admitting: Family Medicine

## 2016-11-15 DIAGNOSIS — M79605 Pain in left leg: Secondary | ICD-10-CM

## 2016-11-15 DIAGNOSIS — M545 Low back pain: Secondary | ICD-10-CM

## 2016-11-15 MED ORDER — METHYLPREDNISOLONE ACETATE 40 MG/ML INJ SUSP (RADIOLOG
120.0000 mg | Freq: Once | INTRAMUSCULAR | Status: AC
  Administered 2016-11-15: 120 mg via EPIDURAL

## 2016-11-15 MED ORDER — IOPAMIDOL (ISOVUE-M 200) INJECTION 41%
1.0000 mL | Freq: Once | INTRAMUSCULAR | Status: AC
  Administered 2016-11-15: 1 mL via EPIDURAL

## 2016-11-15 NOTE — Discharge Instructions (Signed)

## 2017-01-13 ENCOUNTER — Other Ambulatory Visit (INDEPENDENT_AMBULATORY_CARE_PROVIDER_SITE_OTHER): Payer: BLUE CROSS/BLUE SHIELD

## 2017-01-13 LAB — CBC WITH DIFFERENTIAL/PLATELET
BASOS PCT: 1 % (ref 0.0–3.0)
Basophils Absolute: 0 10*3/uL (ref 0.0–0.1)
EOS ABS: 0.2 10*3/uL (ref 0.0–0.7)
EOS PCT: 3.8 % (ref 0.0–5.0)
HCT: 40 % (ref 39.0–52.0)
Hemoglobin: 14.1 g/dL (ref 13.0–17.0)
Lymphocytes Relative: 26.1 % (ref 12.0–46.0)
Lymphs Abs: 1.1 10*3/uL (ref 0.7–4.0)
MCHC: 35.2 g/dL (ref 30.0–36.0)
MCV: 95.4 fl (ref 78.0–100.0)
MONO ABS: 0.5 10*3/uL (ref 0.1–1.0)
Monocytes Relative: 10.9 % (ref 3.0–12.0)
Neutro Abs: 2.4 10*3/uL (ref 1.4–7.7)
Neutrophils Relative %: 58.2 % (ref 43.0–77.0)
Platelets: 200 10*3/uL (ref 150.0–400.0)
RBC: 4.2 Mil/uL — AB (ref 4.22–5.81)
RDW: 13.4 % (ref 11.5–15.5)
WBC: 4.2 10*3/uL (ref 4.0–10.5)

## 2017-01-13 LAB — FERRITIN: FERRITIN: 38.6 ng/mL (ref 22.0–322.0)

## 2017-01-14 NOTE — Progress Notes (Signed)
Continue every 2 month phlebotomy

## 2017-01-15 ENCOUNTER — Other Ambulatory Visit: Payer: Self-pay

## 2017-02-12 ENCOUNTER — Other Ambulatory Visit: Payer: Self-pay | Admitting: Family Medicine

## 2017-02-12 DIAGNOSIS — M545 Low back pain: Principal | ICD-10-CM

## 2017-02-12 DIAGNOSIS — G8929 Other chronic pain: Secondary | ICD-10-CM

## 2017-02-18 ENCOUNTER — Ambulatory Visit
Admission: RE | Admit: 2017-02-18 | Discharge: 2017-02-18 | Disposition: A | Discharge status: Home / Self Care | Payer: BLUE CROSS/BLUE SHIELD | Source: Ambulatory Visit | Attending: Family Medicine | Admitting: Family Medicine

## 2017-02-18 ENCOUNTER — Other Ambulatory Visit: Payer: Self-pay | Admitting: Family Medicine

## 2017-02-18 DIAGNOSIS — M545 Low back pain: Principal | ICD-10-CM

## 2017-02-18 DIAGNOSIS — R52 Pain, unspecified: Secondary | ICD-10-CM

## 2017-02-18 DIAGNOSIS — G8929 Other chronic pain: Secondary | ICD-10-CM

## 2017-02-18 MED ORDER — METHYLPREDNISOLONE ACETATE 40 MG/ML INJ SUSP (RADIOLOG
120.0000 mg | Freq: Once | INTRAMUSCULAR | Status: AC
  Administered 2017-02-18: 120 mg via EPIDURAL

## 2017-02-18 MED ORDER — IOPAMIDOL (ISOVUE-M 200) INJECTION 41%
1.0000 mL | Freq: Once | INTRAMUSCULAR | Status: AC
  Administered 2017-02-18: 1 mL via EPIDURAL

## 2017-04-16 ENCOUNTER — Other Ambulatory Visit (INDEPENDENT_AMBULATORY_CARE_PROVIDER_SITE_OTHER): Payer: BLUE CROSS/BLUE SHIELD

## 2017-04-16 LAB — HEMOGLOBIN: Hemoglobin: 15.3 g/dL (ref 13.0–17.0)

## 2017-04-16 LAB — HEMATOCRIT: HEMATOCRIT: 43.4 % (ref 39.0–52.0)

## 2017-04-23 ENCOUNTER — Other Ambulatory Visit: Payer: Self-pay | Admitting: Family Medicine

## 2017-04-23 DIAGNOSIS — R1906 Epigastric swelling, mass or lump: Secondary | ICD-10-CM

## 2017-04-24 NOTE — Progress Notes (Signed)
Repeat phlebotomy in 2 mos

## 2017-04-28 ENCOUNTER — Ambulatory Visit
Admission: RE | Admit: 2017-04-28 | Discharge: 2017-04-28 | Disposition: A | Discharge status: Home / Self Care | Payer: BLUE CROSS/BLUE SHIELD | Source: Ambulatory Visit | Attending: Family Medicine | Admitting: Family Medicine

## 2017-04-28 DIAGNOSIS — R1906 Epigastric swelling, mass or lump: Secondary | ICD-10-CM

## 2017-06-18 ENCOUNTER — Other Ambulatory Visit (INDEPENDENT_AMBULATORY_CARE_PROVIDER_SITE_OTHER): Payer: BLUE CROSS/BLUE SHIELD

## 2017-06-18 LAB — HEMATOCRIT: HCT: 41 % (ref 39.0–52.0)

## 2017-06-18 LAB — HEMOGLOBIN: HEMOGLOBIN: 14.1 g/dL (ref 13.0–17.0)

## 2017-06-20 ENCOUNTER — Other Ambulatory Visit: Payer: Self-pay

## 2017-06-20 NOTE — Progress Notes (Signed)
Repeat phlebotomy in 2 mos Needs a ferritin then

## 2017-07-31 ENCOUNTER — Other Ambulatory Visit: Payer: Self-pay | Admitting: Family Medicine

## 2017-07-31 DIAGNOSIS — M5416 Radiculopathy, lumbar region: Secondary | ICD-10-CM

## 2017-08-15 ENCOUNTER — Other Ambulatory Visit: Payer: BLUE CROSS/BLUE SHIELD

## 2017-08-18 ENCOUNTER — Ambulatory Visit
Admission: RE | Admit: 2017-08-18 | Discharge: 2017-08-18 | Disposition: A | Discharge status: Home / Self Care | Payer: BLUE CROSS/BLUE SHIELD | Source: Ambulatory Visit | Attending: Family Medicine | Admitting: Family Medicine

## 2017-08-18 ENCOUNTER — Other Ambulatory Visit: Payer: Self-pay | Admitting: Family Medicine

## 2017-08-18 DIAGNOSIS — M5416 Radiculopathy, lumbar region: Secondary | ICD-10-CM

## 2017-08-18 MED ORDER — IOPAMIDOL (ISOVUE-M 200) INJECTION 41%
1.0000 mL | Freq: Once | INTRAMUSCULAR | Status: AC
  Administered 2017-08-18: 1 mL via EPIDURAL

## 2017-08-18 MED ORDER — METHYLPREDNISOLONE ACETATE 40 MG/ML INJ SUSP (RADIOLOG
120.0000 mg | Freq: Once | INTRAMUSCULAR | Status: AC
  Administered 2017-08-18: 120 mg via EPIDURAL

## 2017-09-17 ENCOUNTER — Other Ambulatory Visit: Payer: Self-pay | Admitting: Family Medicine

## 2017-09-17 DIAGNOSIS — G8929 Other chronic pain: Secondary | ICD-10-CM

## 2017-09-17 DIAGNOSIS — M545 Low back pain: Principal | ICD-10-CM

## 2017-10-03 ENCOUNTER — Other Ambulatory Visit: Payer: Self-pay | Admitting: Family Medicine

## 2017-10-03 ENCOUNTER — Ambulatory Visit
Admission: RE | Admit: 2017-10-03 | Discharge: 2017-10-03 | Disposition: A | Discharge status: Home / Self Care | Payer: BLUE CROSS/BLUE SHIELD | Source: Ambulatory Visit | Attending: Family Medicine | Admitting: Family Medicine

## 2017-10-03 DIAGNOSIS — M545 Low back pain: Principal | ICD-10-CM

## 2017-10-03 DIAGNOSIS — G8929 Other chronic pain: Secondary | ICD-10-CM

## 2017-10-03 MED ORDER — IOPAMIDOL (ISOVUE-M 200) INJECTION 41%
1.0000 mL | Freq: Once | INTRAMUSCULAR | Status: AC
  Administered 2017-10-03: 1 mL via EPIDURAL

## 2017-10-03 MED ORDER — METHYLPREDNISOLONE ACETATE 40 MG/ML INJ SUSP (RADIOLOG
120.0000 mg | Freq: Once | INTRAMUSCULAR | Status: AC
  Administered 2017-10-03: 120 mg via EPIDURAL

## 2017-10-03 NOTE — Discharge Instructions (Signed)

## 2017-10-24 ENCOUNTER — Other Ambulatory Visit (INDEPENDENT_AMBULATORY_CARE_PROVIDER_SITE_OTHER): Payer: BLUE CROSS/BLUE SHIELD

## 2017-10-24 LAB — HEMOGLOBIN: Hemoglobin: 14.2 g/dL (ref 13.0–17.0)

## 2017-10-28 NOTE — Progress Notes (Signed)
Phlebotomy in 2 months Needs a ferritin then - lab missed it this time

## 2017-10-29 ENCOUNTER — Other Ambulatory Visit: Payer: Self-pay

## 2017-11-11 ENCOUNTER — Other Ambulatory Visit: Payer: Self-pay | Admitting: Family Medicine

## 2017-11-11 DIAGNOSIS — M545 Low back pain, unspecified: Secondary | ICD-10-CM

## 2017-11-16 ENCOUNTER — Ambulatory Visit
Admission: RE | Admit: 2017-11-16 | Discharge: 2017-11-16 | Disposition: A | Discharge status: Home / Self Care | Payer: BLUE CROSS/BLUE SHIELD | Source: Ambulatory Visit | Attending: Family Medicine | Admitting: Family Medicine

## 2017-11-16 DIAGNOSIS — M545 Low back pain, unspecified: Secondary | ICD-10-CM

## 2017-11-21 ENCOUNTER — Other Ambulatory Visit: Payer: Self-pay | Admitting: Family Medicine

## 2017-11-21 DIAGNOSIS — G8929 Other chronic pain: Secondary | ICD-10-CM

## 2017-11-21 DIAGNOSIS — M545 Low back pain: Principal | ICD-10-CM

## 2017-12-02 ENCOUNTER — Ambulatory Visit
Admission: RE | Admit: 2017-12-02 | Discharge: 2017-12-02 | Disposition: A | Discharge status: Home / Self Care | Payer: BLUE CROSS/BLUE SHIELD | Source: Ambulatory Visit | Attending: Family Medicine | Admitting: Family Medicine

## 2017-12-02 ENCOUNTER — Other Ambulatory Visit: Payer: Self-pay | Admitting: Family Medicine

## 2017-12-02 DIAGNOSIS — G8929 Other chronic pain: Secondary | ICD-10-CM

## 2017-12-02 DIAGNOSIS — M545 Low back pain: Principal | ICD-10-CM

## 2017-12-02 MED ORDER — METHYLPREDNISOLONE ACETATE 40 MG/ML INJ SUSP (RADIOLOG
120.0000 mg | Freq: Once | INTRAMUSCULAR | Status: AC
  Administered 2017-12-02: 120 mg via EPIDURAL

## 2017-12-02 MED ORDER — IOPAMIDOL (ISOVUE-M 200) INJECTION 41%
1.0000 mL | Freq: Once | INTRAMUSCULAR | Status: AC
  Administered 2017-12-02: 1 mL via EPIDURAL

## 2018-02-02 ENCOUNTER — Other Ambulatory Visit (INDEPENDENT_AMBULATORY_CARE_PROVIDER_SITE_OTHER): Payer: BLUE CROSS/BLUE SHIELD

## 2018-02-02 ENCOUNTER — Other Ambulatory Visit: Payer: Self-pay | Admitting: Internal Medicine

## 2018-02-02 LAB — CBC WITH DIFFERENTIAL/PLATELET
BASOS ABS: 0 10*3/uL (ref 0.0–0.1)
BASOS PCT: 0.8 % (ref 0.0–3.0)
EOS ABS: 0.2 10*3/uL (ref 0.0–0.7)
Eosinophils Relative: 3.9 % (ref 0.0–5.0)
HEMATOCRIT: 38.2 % — AB (ref 39.0–52.0)
HEMOGLOBIN: 13.3 g/dL (ref 13.0–17.0)
LYMPHS PCT: 27.9 % (ref 12.0–46.0)
Lymphs Abs: 1.2 10*3/uL (ref 0.7–4.0)
MCHC: 34.8 g/dL (ref 30.0–36.0)
MCV: 96.8 fl (ref 78.0–100.0)
Monocytes Absolute: 0.4 10*3/uL (ref 0.1–1.0)
Monocytes Relative: 9.8 % (ref 3.0–12.0)
Neutro Abs: 2.4 10*3/uL (ref 1.4–7.7)
Neutrophils Relative %: 57.6 % (ref 43.0–77.0)
Platelets: 201 10*3/uL (ref 150.0–400.0)
RBC: 3.95 Mil/uL — ABNORMAL LOW (ref 4.22–5.81)
RDW: 13.1 % (ref 11.5–15.5)
WBC: 4.2 10*3/uL (ref 4.0–10.5)

## 2018-02-02 LAB — FERRITIN: Ferritin: 10.1 ng/mL — ABNORMAL LOW (ref 22.0–322.0)

## 2018-02-04 NOTE — Progress Notes (Signed)
Repeat phlebotomy in 3 mos

## 2018-04-27 ENCOUNTER — Other Ambulatory Visit (INDEPENDENT_AMBULATORY_CARE_PROVIDER_SITE_OTHER): Payer: BLUE CROSS/BLUE SHIELD

## 2018-04-27 ENCOUNTER — Other Ambulatory Visit: Payer: Self-pay | Admitting: Emergency Medicine

## 2018-04-27 LAB — CBC WITH DIFFERENTIAL/PLATELET
BASOS ABS: 0 10*3/uL (ref 0.0–0.1)
BASOS PCT: 0.8 % (ref 0.0–3.0)
Eosinophils Absolute: 0.2 10*3/uL (ref 0.0–0.7)
Eosinophils Relative: 3.1 % (ref 0.0–5.0)
HCT: 38.2 % — ABNORMAL LOW (ref 39.0–52.0)
HEMOGLOBIN: 13 g/dL (ref 13.0–17.0)
Lymphocytes Relative: 21.3 % (ref 12.0–46.0)
Lymphs Abs: 1.2 10*3/uL (ref 0.7–4.0)
MCHC: 34.2 g/dL (ref 30.0–36.0)
MCV: 92.2 fl (ref 78.0–100.0)
MONOS PCT: 9.1 % (ref 3.0–12.0)
Monocytes Absolute: 0.5 10*3/uL (ref 0.1–1.0)
NEUTROS ABS: 3.8 10*3/uL (ref 1.4–7.7)
Neutrophils Relative %: 65.7 % (ref 43.0–77.0)
PLATELETS: 229 10*3/uL (ref 150.0–400.0)
RBC: 4.14 Mil/uL — ABNORMAL LOW (ref 4.22–5.81)
RDW: 13.1 % (ref 11.5–15.5)
WBC: 5.8 10*3/uL (ref 4.0–10.5)

## 2018-05-01 ENCOUNTER — Other Ambulatory Visit: Payer: Self-pay

## 2018-05-01 NOTE — Progress Notes (Signed)
Ask patient to return next week and do a ferritin  Dx is hemochromatosis

## 2018-07-08 ENCOUNTER — Other Ambulatory Visit (INDEPENDENT_AMBULATORY_CARE_PROVIDER_SITE_OTHER): Payer: BLUE CROSS/BLUE SHIELD

## 2018-07-08 LAB — CBC
HEMATOCRIT: 37.5 % — AB (ref 39.0–52.0)
HEMOGLOBIN: 13 g/dL (ref 13.0–17.0)
MCHC: 34.7 g/dL (ref 30.0–36.0)
MCV: 90.3 fl (ref 78.0–100.0)
Platelets: 258 10*3/uL (ref 150.0–400.0)
RBC: 4.15 Mil/uL — ABNORMAL LOW (ref 4.22–5.81)
RDW: 14.1 % (ref 11.5–15.5)
WBC: 7.5 10*3/uL (ref 4.0–10.5)

## 2018-07-08 LAB — FERRITIN: Ferritin: 15.6 ng/mL — ABNORMAL LOW (ref 22.0–322.0)

## 2018-07-15 NOTE — Progress Notes (Signed)
ok 

## 2018-07-15 NOTE — Progress Notes (Signed)
Ferritin and Hct low despite no phlebotomy  Please ask him/ask Dr. Sandi Mariscal if he has done any hemoccults Send these results to Dr. Sandi Mariscal also please  I do think he should have a screening colonoscopy which he can set updirect or we can schedule an OV to review  I am wondering if he is losing iron(blood) somehow  Any signs of bleeding?  Any GI sxs?

## 2018-10-23 ENCOUNTER — Encounter: Payer: Self-pay | Admitting: Internal Medicine

## 2018-10-23 ENCOUNTER — Telehealth: Payer: Self-pay | Admitting: Internal Medicine

## 2018-10-23 NOTE — Telephone Encounter (Signed)
Pt is sched for colon 2/6 9:00 AM Dr. Carlean Purl.

## 2018-10-23 NOTE — Telephone Encounter (Addendum)
Left message to call back to schedule screening colon. Received call from [facility] at [facility] office requesting to schedule pt for first colonoscopy. She is requesting information about colonoscopy date and time faxed to [phone] to her attention.

## 2018-10-28 HISTORY — PX: BACK SURGERY: SHX140

## 2018-11-19 ENCOUNTER — Encounter: Payer: Self-pay | Admitting: Internal Medicine

## 2018-11-19 ENCOUNTER — Ambulatory Visit (AMBULATORY_SURGERY_CENTER): Payer: Self-pay | Admitting: *Deleted

## 2018-11-19 VITALS — Ht 66.5 in | Wt 151.0 lb

## 2018-11-19 DIAGNOSIS — Z1211 Encounter for screening for malignant neoplasm of colon: Secondary | ICD-10-CM

## 2018-11-19 NOTE — Progress Notes (Signed)
No egg or soy allergy known to patient  No issues with past sedation with any surgeries  or procedures, no intubation problems  No diet pills per patient No home 02 use per patient  No blood thinners per patient  Pt denies issues with constipation  No A fib or A flutter  EMMI video sent to pt's e mail pt declined   

## 2018-12-03 ENCOUNTER — Ambulatory Visit (AMBULATORY_SURGERY_CENTER): Payer: BLUE CROSS/BLUE SHIELD | Admitting: Internal Medicine

## 2018-12-03 ENCOUNTER — Encounter: Payer: Self-pay | Admitting: Internal Medicine

## 2018-12-03 VITALS — BP 125/76 | HR 54 | Temp 97.5°F | Resp 14 | Ht 66.5 in | Wt 151.0 lb

## 2018-12-03 DIAGNOSIS — Z1211 Encounter for screening for malignant neoplasm of colon: Secondary | ICD-10-CM | POA: Diagnosis present

## 2018-12-03 MED ORDER — SODIUM CHLORIDE 0.9 % IV SOLN: 500.0000 mL | Freq: Once | INTRAVENOUS | Status: DC

## 2018-12-03 NOTE — Progress Notes (Signed)
Report given to PACU, vss 

## 2018-12-03 NOTE — Patient Instructions (Addendum)
   No polyps or cancer seen.  Next routine colonoscopy or other screening test in 10 years - 2030  I appreciate the opportunity to care for you. Gatha Mayer, MD, FACG YOU HAD AN ENDOSCOPIC PROCEDURE TODAY AT [facility]:   Refer to the procedure report that was given to you for any specific questions about what was found during the examination.  If the procedure report does not answer your questions, please call your gastroenterologist to clarify.  If you requested that your care partner not be given the details of your procedure findings, then the procedure report has been included in a sealed envelope for you to review at your convenience later.  YOU SHOULD EXPECT: Some feelings of bloating in the abdomen. Passage of more gas than usual.  Walking can help get rid of the air that was put into your GI tract during the procedure and reduce the bloating. If you had a lower endoscopy (such as a colonoscopy or flexible sigmoidoscopy) you may notice spotting of blood in your stool or on the toilet paper. If you underwent a bowel prep for your procedure, you may not have a normal bowel movement for a few days.  Please Note:  You might notice some irritation and congestion in your nose or some drainage.  This is from the oxygen used during your procedure.  There is no need for concern and it should clear up in a day or so.  SYMPTOMS TO REPORT IMMEDIATELY:   Following lower endoscopy (colonoscopy or flexible sigmoidoscopy):  Excessive amounts of blood in the stool  Significant tenderness or worsening of abdominal pains  Swelling of the abdomen that is new, acute  Fever of 100F or higher  For urgent or emergent issues, a gastroenterologist can be reached at any hour by calling [phone].   DIET:  We do recommend a small meal at first, but then you may proceed to your regular diet.  Drink plenty of fluids but you should avoid alcoholic beverages for 24  hours.  ACTIVITY:  You should plan to take it easy for the rest of today and you should NOT DRIVE or use heavy machinery until tomorrow (because of the sedation medicines used during the test).    FOLLOW UP: Our staff will call the number listed on your records the next business day following your procedure to check on you and address any questions or concerns that you may have regarding the information given to you following your procedure. If we do not reach you, we will leave a message.  However, if you are feeling well and you are not experiencing any problems, there is no need to return our call.  We will assume that you have returned to your regular daily activities without incident.  If any biopsies were taken you will be contacted by phone or by letter within the next 1-3 weeks.  Please call us at [phone] if you have not heard about the biopsies in 3 weeks.    SIGNATURES/CONFIDENTIALITY: You and/or your care partner have signed paperwork which will be entered into your electronic medical record.  These signatures attest to the fact that that the information above on your After Visit Summary has been reviewed and is understood.  Full responsibility of the confidentiality of this discharge information lies with you and/or your care-partner.

## 2018-12-03 NOTE — Op Note (Signed)
[place] Patient Name: [name] Procedure Date: 12/03/2018 9:11 AM MRN: [id number] Endoscopist: Gatha Mayer , MD Age: 53 Referring MD:  Date of Birth: June 13, 1966 Gender: Male Account #: [id number] Procedure:                Colonoscopy Indications:              Screening for colorectal malignant neoplasm Medicines:                Propofol per Anesthesia, Monitored Anesthesia Care Procedure:                Pre-Anesthesia Assessment:                           - Prior to the procedure, a History and Physical                            was performed, and patient medications and                            allergies were reviewed. The patient's tolerance of                            previous anesthesia was also reviewed. The risks                            and benefits of the procedure and the sedation                            options and risks were discussed with the patient.                            All questions were answered, and informed consent                            was obtained. Prior Anticoagulants: The patient has                            taken no previous anticoagulant or antiplatelet                            agents. ASA Grade Assessment: II - A patient with                            mild systemic disease. After reviewing the risks                            and benefits, the patient was deemed in                            satisfactory condition to undergo the procedure.                           After obtaining informed consent, the colonoscope  was passed under direct vision. Throughout the                            procedure, the patient's blood pressure, pulse, and                            oxygen saturations were monitored continuously. The                            Colonoscope was introduced through the anus and                            advanced to the the cecum, identified by   appendiceal orifice and ileocecal valve. The                            quality of the bowel preparation was good. The                            colonoscopy was performed without difficulty. The                            patient tolerated the procedure well. The bowel                            preparation used was Miralax. Scope In: 9:19:57 AM Scope Out: 9:33:39 AM Scope Withdrawal Time: 0 hours 9 minutes 57 seconds  Total Procedure Duration: 0 hours 13 minutes 42 seconds  Findings:                 The perianal and digital rectal examinations were                            normal. Pertinent negatives include normal prostate                            (size, shape, and consistency).                           The colon (entire examined portion) appeared normal.                           No additional abnormalities were found on                            retroflexion except some hypertrophied anal                            papillae. Complications:            No immediate complications. Estimated blood loss:                            None. Estimated Blood Loss:     Estimated blood loss: none. Impression:               - The entire examined  colon is normal.                            Hypertrophied anal papillae in rectum.                           - No specimens collected. Recommendation:           - Repeat colonoscopy in 10 years for screening                            purposes.                           - Patient has a contact number available for                            emergencies. The signs and symptoms of potential                            delayed complications were discussed with the                            patient. Return to normal activities tomorrow.                            Written discharge instructions were provided to the                            patient.                           - Resume previous diet.                           - Continue present  medications. Gatha Mayer, MD 12/03/2018 9:41:11 AM This report has been signed electronically.

## 2018-12-03 NOTE — Progress Notes (Signed)
Pt's states no medical or surgical changes since previsit or office visit. 

## 2018-12-04 ENCOUNTER — Telehealth: Payer: Self-pay | Admitting: *Deleted

## 2018-12-04 NOTE — Telephone Encounter (Signed)
  Follow up Call-  Call back number 12/03/2018  Post procedure Call Back phone  # [phone]  Permission to leave phone message Yes  Some recent data might be hidden     Patient questions:  Do you have a fever, pain , or abdominal swelling? No. Pain Score  0 *  Have you tolerated food without any problems? Yes.    Have you been able to return to your normal activities? Yes.    Do you have any questions about your discharge instructions: Diet   No. Medications  No. Follow up visit  No.  Do you have questions or concerns about your Care? No.  Actions: * If pain score is 4 or above: No action needed, pain <4.

## 2018-12-16 ENCOUNTER — Telehealth: Payer: Self-pay | Admitting: Internal Medicine

## 2018-12-16 NOTE — Telephone Encounter (Signed)
Patient notified  He did not have labs at Dr. Deboraha Sprang office He will come for labs at his convenience

## 2018-12-16 NOTE — Telephone Encounter (Signed)
He needs a CBC and ferritin Dx hemochromatosis  Unless he did that at Dr. Deboraha Sprang (will need results)

## 2018-12-16 NOTE — Telephone Encounter (Signed)
Okay to resume the phlebotomy? I do not see any mention to stop.  Please advise

## 2018-12-16 NOTE — Telephone Encounter (Signed)
Pt wants to know if he can resume his therapeutic phlebotomy that Dr. Carlean Purl asked him to stop before his procedure.

## 2018-12-23 ENCOUNTER — Other Ambulatory Visit (INDEPENDENT_AMBULATORY_CARE_PROVIDER_SITE_OTHER): Payer: BLUE CROSS/BLUE SHIELD

## 2018-12-23 LAB — CBC
HEMATOCRIT: 39.6 % (ref 39.0–52.0)
Hemoglobin: 13.8 g/dL (ref 13.0–17.0)
MCHC: 34.9 g/dL (ref 30.0–36.0)
MCV: 92.2 fl (ref 78.0–100.0)
PLATELETS: 189 10*3/uL (ref 150.0–400.0)
RBC: 4.3 Mil/uL (ref 4.22–5.81)
RDW: 14.4 % (ref 11.5–15.5)
WBC: 4.8 10*3/uL (ref 4.0–10.5)

## 2018-12-24 LAB — FERRITIN: Ferritin: 8.5 ng/mL — ABNORMAL LOW (ref 22.0–322.0)

## 2018-12-24 NOTE — Progress Notes (Signed)
His ferritin remains low despite no phlebotomy. This does not make sense given hemochromatosis dx.  Had a negative screening colonoscopy last year. I recommend he have an EGD to look for a source of blood loss that could be slow and ongoing and create this situation. Dx iron deficiency anemia  Can do direct or if he wishes to discuss schedule REV

## 2018-12-25 ENCOUNTER — Telehealth: Payer: Self-pay | Admitting: Internal Medicine

## 2018-12-25 NOTE — Telephone Encounter (Signed)
Gatha Mayer, MD  Marlon Pel, RN        His ferritin remains low despite no phlebotomy.  This does not make sense given hemochromatosis dx.   Had a negative screening colonoscopy last year.  I recommend he have an EGD to look for a source of blood loss that could be slow and ongoing and create this situation.  Dx iron deficiency anemia   Can do direct or if he wishes to discuss schedule REV

## 2018-12-25 NOTE — Telephone Encounter (Signed)
Pt returned call about lab results. Pls call him again.

## 2018-12-25 NOTE — Telephone Encounter (Signed)
The pt was advised and states he will call Monday and schedule either the EGD or the ROV.

## 2018-12-28 ENCOUNTER — Telehealth: Payer: Self-pay | Admitting: Internal Medicine

## 2018-12-28 NOTE — Telephone Encounter (Signed)
Left message for patient to call back  

## 2018-12-28 NOTE — Telephone Encounter (Signed)
Pt called in wanting to speak with the doctor about the Endoscopy that was discuss in the last appt.

## 2018-12-28 NOTE — Telephone Encounter (Signed)
Discussed with patient reason for EGd.  All questions answered.  He will call back to schedule once he secures a ride

## 2018-12-30 ENCOUNTER — Encounter: Payer: Self-pay | Admitting: Internal Medicine

## 2019-01-05 ENCOUNTER — Ambulatory Visit (AMBULATORY_SURGERY_CENTER): Payer: Self-pay | Admitting: *Deleted

## 2019-01-05 ENCOUNTER — Encounter: Payer: Self-pay | Admitting: Internal Medicine

## 2019-01-05 DIAGNOSIS — D509 Iron deficiency anemia, unspecified: Secondary | ICD-10-CM

## 2019-01-05 NOTE — Progress Notes (Signed)
No egg or soy allergy known to patient  No issues with past sedation with any surgeries  or procedures, no intubation problems  No diet pills per patient No home 02 use per patient  No blood thinners per patient  Pt denies issues with constipation  No A fib or A flutter  EMMI video sent to pt's e mail  Patient stating on last visit Blood Sugar was too low and he will leave off his Metformin the night before and day of procedure. Patient instructed not to sue snuff the day of procedure.

## 2019-01-17 ENCOUNTER — Telehealth: Payer: Self-pay

## 2019-01-17 NOTE — Telephone Encounter (Signed)
Covid-19 travel screening questions  Have you traveled in the last 14 days? Yes If yes where? [place]  Do you now or have you had a fever in the last 14 days? No  Do you have any respiratory symptoms of shortness of breath or cough now or in the last 14 days? No  Do you have a medical history of Congestive Heart Failure?  Do you have a medical history of lung disease?  Do you have any family members or close contacts with diagnosed or suspected Covid-19? No.   Patient did go to [facility] a little over a week ago. Denies any symptoms. I explained the "No care partner in the lobby" policy and he understood. No further questions.

## 2019-01-18 ENCOUNTER — Other Ambulatory Visit: Payer: Self-pay

## 2019-01-18 ENCOUNTER — Encounter: Payer: Self-pay | Admitting: Internal Medicine

## 2019-01-18 ENCOUNTER — Ambulatory Visit (AMBULATORY_SURGERY_CENTER): Payer: BLUE CROSS/BLUE SHIELD | Admitting: Internal Medicine

## 2019-01-18 DIAGNOSIS — D509 Iron deficiency anemia, unspecified: Secondary | ICD-10-CM

## 2019-01-18 DIAGNOSIS — E611 Iron deficiency: Secondary | ICD-10-CM

## 2019-01-18 MED ORDER — SODIUM CHLORIDE 0.9 % IV SOLN: 500.0000 mL | Freq: Once | INTRAVENOUS | Status: DC

## 2019-01-18 NOTE — Progress Notes (Signed)
To PACU, VSS. Report to [email or web address] 

## 2019-01-18 NOTE — Op Note (Signed)
[place] Patient Name: [name] Procedure Date: 01/18/2019 7:51 AM MRN: [id number] Endoscopist: Gatha Mayer , MD Age: 53 Referring MD:  Date of Birth: May 14, 1966 Gender: Male Account #: [id number] Procedure:                Upper GI endoscopy Indications:              Iron deficiency anemia with no gastrointestinal                            bleeding source identified during previous                            colonoscopy Medicines:                Propofol per Anesthesia, Monitored Anesthesia Care Procedure:                Pre-Anesthesia Assessment:                           - Prior to the procedure, a History and Physical                            was performed, and patient medications and                            allergies were reviewed. The patient's tolerance of                            previous anesthesia was also reviewed. The risks                            and benefits of the procedure and the sedation                            options and risks were discussed with the patient.                            All questions were answered, and informed consent                            was obtained. Prior Anticoagulants: The patient has                            taken no previous anticoagulant or antiplatelet                            agents. ASA Grade Assessment: II - A patient with                            mild systemic disease. After reviewing the risks                            and benefits, the patient was deemed in  satisfactory condition to undergo the procedure.                           After obtaining informed consent, the endoscope was                            passed under direct vision. Throughout the                            procedure, the patient's blood pressure, pulse, and                            oxygen saturations were monitored continuously. The                            Endoscope was introduced  through the mouth, and                            advanced to the second part of duodenum. The upper                            GI endoscopy was accomplished without difficulty.                            The patient tolerated the procedure well. Scope In: Scope Out: Findings:                 The examined esophagus was normal.                           A medium amount of food (residue) was found in the                            gastric fundus, in the gastric body and in the                            gastric antrum.                           Food (residue) was found in the duodenal bulb.                           The exam was otherwise without abnormality.                           The cardia and gastric fundus were normal on                            retroflexion. Complications:            No immediate complications. Estimated Blood Loss:     Estimated blood loss: none. Impression:               - Normal esophagus.                           -  A medium amount of food (residue) in the stomach.                           - Retained food in the duodenum.                           - The examination was otherwise normal.                           - No specimens collected. Recommendation:           - Patient has a contact number available for                            emergencies. The signs and symptoms of potential                            delayed complications were discussed with the                            patient. Return to normal activities tomorrow.                            Written discharge instructions were provided to the                            patient.                           - Resume previous diet.                           - Continue present medications.                           - Have CBC and ferritin May 4 approximately                           I do not know why food in stomach today                           No known gastroparesis                            Cannot give complete clearance of lesions due to                            foo dretention                           Could need repeat but at this point assume prior                            phlebotomy for hemopchromatosis caused iron  deficiency. Gatha Mayer, MD 01/18/2019 8:30:48 AM This report has been signed electronically.

## 2019-01-18 NOTE — Patient Instructions (Addendum)
There was some retained food in your stomach.  I don't know why.  This limits the exam some but I did not see any problems with ulcers or bleeding.  I want you to come on or shortly after 5/4 and repeat labs and we will figure out any next steps.  I appreciate the opportunity to care for you. Gatha Mayer, MD, FACG   YOU HAD AN ENDOSCOPIC PROCEDURE TODAY AT [facility]:   Refer to the procedure report that was given to you for any specific questions about what was found during the examination.  If the procedure report does not answer your questions, please call your gastroenterologist to clarify.  If you requested that your care partner not be given the details of your procedure findings, then the procedure report has been included in a sealed envelope for you to review at your convenience later.  YOU SHOULD EXPECT: Some feelings of bloating in the abdomen. Passage of more gas than usual.  Walking can help get rid of the air that was put into your GI tract during the procedure and reduce the bloating. If you had a lower endoscopy (such as a colonoscopy or flexible sigmoidoscopy) you may notice spotting of blood in your stool or on the toilet paper. If you underwent a bowel prep for your procedure, you may not have a normal bowel movement for a few days.  Please Note:  You might notice some irritation and congestion in your nose or some drainage.  This is from the oxygen used during your procedure.  There is no need for concern and it should clear up in a day or so.  SYMPTOMS TO REPORT IMMEDIATELY:     Following upper endoscopy (EGD)  Vomiting of blood or coffee ground material  New chest pain or pain under the shoulder blades  Painful or persistently difficult swallowing  New shortness of breath  Fever of 100F or higher  Black, tarry-looking stools  For urgent or emergent issues, a gastroenterologist can be reached at any hour by calling [phone].    DIET:  We do recommend a small meal at first, but then you may proceed to your regular diet.  Drink plenty of fluids but you should avoid alcoholic beverages for 24 hours.  ACTIVITY:  You should plan to take it easy for the rest of today and you should NOT DRIVE or use heavy machinery until tomorrow (because of the sedation medicines used during the test).    FOLLOW UP: Our staff will call the number listed on your records the next business day following your procedure to check on you and address any questions or concerns that you may have regarding the information given to you following your procedure. If we do not reach you, we will leave a message.  However, if you are feeling well and you are not experiencing any problems, there is no need to return our call.  We will assume that you have returned to your regular daily activities without incident.  If any biopsies were taken you will be contacted by phone or by letter within the next 1-3 weeks.  Please call us at [phone] if you have not heard about the biopsies in 3 weeks.    SIGNATURES/CONFIDENTIALITY: You and/or your care partner have signed paperwork which will be entered into your electronic medical record.  These signatures attest to the fact that that the information above on your After Visit Summary has been reviewed  and is understood.  Full responsibility of the confidentiality of this discharge information lies with you and/or your care-partner. 

## 2019-01-18 NOTE — Progress Notes (Signed)
Pt's states no medical or surgical changes since previsit or office visit. 

## 2019-01-19 ENCOUNTER — Telehealth: Payer: Self-pay

## 2019-01-19 NOTE — Telephone Encounter (Signed)
  Follow up Call-  Call back number 01/18/2019 12/03/2018  Post procedure Call Back phone  # [phone]  Permission to leave phone message Yes Yes  Some recent data might be hidden     Patient questions:  Do you have a fever, pain , or abdominal swelling? No. Pain Score  0 *  Have you tolerated food without any problems? Yes.    Have you been able to return to your normal activities? Yes.    Do you have any questions about your discharge instructions: Diet   No. Medications  No. Follow up visit  No.  Do you have questions or concerns about your Care? No.  Actions: * If pain score is 4 or above: No action needed, pain <4.

## 2019-03-15 ENCOUNTER — Other Ambulatory Visit: Payer: Self-pay | Admitting: Family Medicine

## 2019-03-15 DIAGNOSIS — G8929 Other chronic pain: Secondary | ICD-10-CM

## 2019-03-16 ENCOUNTER — Other Ambulatory Visit (INDEPENDENT_AMBULATORY_CARE_PROVIDER_SITE_OTHER): Payer: BLUE CROSS/BLUE SHIELD

## 2019-03-16 LAB — CBC
HCT: 36.9 % — ABNORMAL LOW (ref 39.0–52.0)
Hemoglobin: 13 g/dL (ref 13.0–17.0)
MCHC: 35.4 g/dL (ref 30.0–36.0)
MCV: 95.3 fl (ref 78.0–100.0)
Platelets: 195 10*3/uL (ref 150.0–400.0)
RBC: 3.87 Mil/uL — ABNORMAL LOW (ref 4.22–5.81)
RDW: 13.1 % (ref 11.5–15.5)
WBC: 4.3 10*3/uL (ref 4.0–10.5)

## 2019-03-16 LAB — FERRITIN: Ferritin: 26 ng/mL (ref 22.0–322.0)

## 2019-03-18 ENCOUNTER — Other Ambulatory Visit: Payer: Self-pay

## 2019-03-18 NOTE — Progress Notes (Signed)
Iron levels up but not high enough to restart phlebotomy Please repeat CBC and ferritin in mid July Dx hemochromatosis

## 2019-04-05 ENCOUNTER — Other Ambulatory Visit: Payer: Self-pay

## 2019-04-05 ENCOUNTER — Ambulatory Visit
Admission: RE | Admit: 2019-04-05 | Discharge: 2019-04-05 | Disposition: A | Discharge status: Home / Self Care | Payer: BC Managed Care – PPO | Source: Ambulatory Visit | Attending: Family Medicine | Admitting: Family Medicine

## 2019-04-05 DIAGNOSIS — G8929 Other chronic pain: Secondary | ICD-10-CM

## 2019-04-05 DIAGNOSIS — M545 Low back pain, unspecified: Secondary | ICD-10-CM

## 2019-04-05 MED ORDER — METHYLPREDNISOLONE ACETATE 40 MG/ML INJ SUSP (RADIOLOG
120.0000 mg | Freq: Once | INTRAMUSCULAR | Status: AC
  Administered 2019-04-05: 120 mg via EPIDURAL

## 2019-04-05 MED ORDER — IOPAMIDOL (ISOVUE-M 200) INJECTION 41%
1.0000 mL | Freq: Once | INTRAMUSCULAR | Status: AC
  Administered 2019-04-05: 11:00:00 1 mL via EPIDURAL

## 2019-04-23 ENCOUNTER — Other Ambulatory Visit: Payer: Self-pay | Admitting: Family Medicine

## 2019-04-23 DIAGNOSIS — R202 Paresthesia of skin: Secondary | ICD-10-CM

## 2019-04-23 DIAGNOSIS — M5416 Radiculopathy, lumbar region: Secondary | ICD-10-CM

## 2019-04-23 DIAGNOSIS — R29898 Other symptoms and signs involving the musculoskeletal system: Secondary | ICD-10-CM

## 2019-04-23 DIAGNOSIS — M79605 Pain in left leg: Secondary | ICD-10-CM

## 2019-05-13 ENCOUNTER — Other Ambulatory Visit: Payer: Self-pay

## 2019-05-13 ENCOUNTER — Ambulatory Visit
Admission: RE | Admit: 2019-05-13 | Discharge: 2019-05-13 | Disposition: A | Discharge status: Home / Self Care | Payer: BC Managed Care – PPO | Source: Ambulatory Visit | Attending: Family Medicine | Admitting: Family Medicine

## 2019-05-13 DIAGNOSIS — M79605 Pain in left leg: Secondary | ICD-10-CM

## 2019-05-13 DIAGNOSIS — R202 Paresthesia of skin: Secondary | ICD-10-CM

## 2019-05-13 DIAGNOSIS — M5416 Radiculopathy, lumbar region: Secondary | ICD-10-CM

## 2019-05-13 DIAGNOSIS — R29898 Other symptoms and signs involving the musculoskeletal system: Secondary | ICD-10-CM

## 2019-05-14 ENCOUNTER — Other Ambulatory Visit: Payer: BC Managed Care – PPO

## 2019-05-28 ENCOUNTER — Other Ambulatory Visit (INDEPENDENT_AMBULATORY_CARE_PROVIDER_SITE_OTHER): Payer: BC Managed Care – PPO

## 2019-05-28 LAB — CBC
HCT: 39.8 % (ref 39.0–52.0)
Hemoglobin: 14 g/dL (ref 13.0–17.0)
MCHC: 35.1 g/dL (ref 30.0–36.0)
MCV: 96.3 fl (ref 78.0–100.0)
Platelets: 234 10*3/uL (ref 150.0–400.0)
RBC: 4.14 Mil/uL — ABNORMAL LOW (ref 4.22–5.81)
RDW: 13.2 % (ref 11.5–15.5)
WBC: 5.5 10*3/uL (ref 4.0–10.5)

## 2019-05-28 LAB — FERRITIN: Ferritin: 36.3 ng/mL (ref 22.0–322.0)

## 2019-05-31 ENCOUNTER — Other Ambulatory Visit: Payer: Self-pay

## 2019-05-31 NOTE — Progress Notes (Signed)
Cbc

## 2019-05-31 NOTE — Progress Notes (Signed)
Iron levels still aty a level that does not need phlebotomy  Recheck ferritin and CBC in early October

## 2019-06-10 ENCOUNTER — Other Ambulatory Visit: Payer: Self-pay | Admitting: Neurosurgery

## 2019-06-15 NOTE — Progress Notes (Signed)
[place], [place] [address] Phone: [phone] Fax: [phone]      Your procedure is scheduled on August 31st.  Report to [facility] Main Entrance "A" at 7:30 A.M., and check in at the Admitting office.  Call this number if you have problems the morning of surgery:  [phone]  Call [phone] if you have any questions prior to your surgery date Monday-Friday 8am-4pm    Remember:  Do not eat or drink after midnight the night before your surgery     Take these medicines the morning of surgery with A SIP OF WATER   Tylenol - if needed  Atorvastatin (lipitor)  Hydrocodone-Acetaminophen - if needed    7 days prior to surgery STOP taking any Aspirin (unless otherwise instructed by your surgeon), Aleve, Naproxen, Ibuprofen, Motrin, Advil, Goody's, BC's, all herbal medications, fish oil, and all vitamins.   WHAT DO I DO ABOUT MY DIABETES MEDICATION?   Marland Kitchen Do not take oral diabetes medicines (pills) the morning of surgery. - Metformin   How to Manage Your Diabetes Before and After Surgery  Why is it important to control my blood sugar before and after surgery? . Improving blood sugar levels before and after surgery helps healing and can limit problems. . A way of improving blood sugar control is eating a healthy diet by: o  Eating less sugar and carbohydrates o  Increasing activity/exercise o  Talking with your doctor about reaching your blood sugar goals . High blood sugars (greater than 180 mg/dL) can raise your risk of infections and slow your recovery, so you will need to focus on controlling your diabetes during the weeks before surgery. . Make sure that the doctor who takes care of your diabetes knows about your planned surgery including the date and location.  How do I manage my blood sugar before surgery? . Check your blood sugar at least 4 times a day, starting 2 days before surgery,  to make sure that the level is not too high or low. o Check your blood sugar the morning of your surgery when you wake up and every 2 hours until you get to the Short Stay unit. . If your blood sugar is less than 70 mg/dL, you will need to treat for low blood sugar: o Do not take insulin. o Treat a low blood sugar (less than 70 mg/dL) with  cup of clear juice (cranberry or apple), 4 glucose tablets, OR glucose gel. o Recheck blood sugar in 15 minutes after treatment (to make sure it is greater than 70 mg/dL). If your blood sugar is not greater than 70 mg/dL on recheck, call [phone] for further instructions. . Report your blood sugar to the short stay nurse when you get to Short Stay.  . If you are admitted to the hospital after surgery: o Your blood sugar will be checked by the staff and you will probably be given insulin after surgery (instead of oral diabetes medicines) to make sure you have good blood sugar levels. o The goal for blood sugar control after surgery is 80-180 mg/dL.    The Morning of Surgery  Do not wear jewelry.  Do not wear lotions, powders, or perfumes/colognes, or deodorant  Do not shave 48 hours prior to surgery.  Men may shave face and neck.  Do not bring valuables to the hospital.  [facility] is not responsible for any belongings or valuables.  If you are a smoker, DO NOT Smoke 24 hours prior to surgery IF you wear a CPAP at night please bring your mask, tubing, and machine the morning of surgery   Remember that you must have someone to transport you home after your surgery, and remain with you for 24 hours if you are discharged the same day.   Contacts, glasses, hearing aids, dentures or bridgework may not be worn into surgery.    Leave your suitcase in the car.  After surgery it may be brought to your room.  For patients admitted to the hospital, discharge time will be determined by your treatment team.  Patients discharged the day of surgery will  not be allowed to drive home.    Special instructions:   [place]- Preparing For Surgery  Before surgery, you can play an important role. Because skin is not sterile, your skin needs to be as free of germs as possible. You can reduce the number of germs on your skin by washing with CHG (chlorahexidine gluconate) Soap before surgery.  CHG is an antiseptic cleaner which kills germs and bonds with the skin to continue killing germs even after washing.    Oral Hygiene is also important to reduce your risk of infection.  Remember - BRUSH YOUR TEETH THE MORNING OF SURGERY WITH YOUR REGULAR TOOTHPASTE  Please do not use if you have an allergy to CHG or antibacterial soaps. If your skin becomes reddened/irritated stop using the CHG.  Do not shave (including legs and underarms) for at least 48 hours prior to first CHG shower. It is OK to shave your face.  Please follow these instructions carefully.   1. Shower the NIGHT BEFORE SURGERY and the MORNING OF SURGERY with CHG Soap.   2. If you chose to wash your hair, wash your hair first as usual with your normal shampoo.  3. After you shampoo, rinse your hair and body thoroughly to remove the shampoo.  4. Use CHG as you would any other liquid soap. You can apply CHG directly to the skin and wash gently with a scrungie or a clean washcloth.   5. Apply the CHG Soap to your body ONLY FROM THE NECK DOWN.  Do not use on open wounds or open sores. Avoid contact with your eyes, ears, mouth and genitals (private parts). Wash Face and genitals (private parts)  with your normal soap.   6. Wash thoroughly, paying special attention to the area where your surgery will be performed.  7. Thoroughly rinse your body with warm water from the neck down.  8. DO NOT shower/wash with your normal soap after using and rinsing off the CHG Soap.  9. Pat yourself dry with a CLEAN TOWEL.  10. Wear CLEAN PAJAMAS to bed the night before surgery, wear comfortable clothes  the morning of surgery  11. Place CLEAN SHEETS on your bed the night of your first shower and DO NOT SLEEP WITH PETS.   Day of Surgery:  Do not apply any deodorants/lotions. Please shower the morning of surgery with the CHG soap  Please wear clean clothes to the hospital/surgery center.   Remember to brush your teeth WITH YOUR REGULAR TOOTHPASTE.   Please read over the following fact sheets that you were given.

## 2019-06-16 ENCOUNTER — Other Ambulatory Visit: Payer: Self-pay

## 2019-06-16 ENCOUNTER — Encounter (HOSPITAL_COMMUNITY)
Admission: RE | Admit: 2019-06-16 | Discharge: 2019-06-16 | Disposition: A | Discharge status: Home / Self Care | Payer: BC Managed Care – PPO | Source: Ambulatory Visit | Attending: Neurosurgery | Admitting: Neurosurgery

## 2019-06-16 ENCOUNTER — Encounter (HOSPITAL_COMMUNITY): Payer: Self-pay

## 2019-06-16 DIAGNOSIS — Z01812 Encounter for preprocedural laboratory examination: Secondary | ICD-10-CM | POA: Diagnosis not present

## 2019-06-16 LAB — BASIC METABOLIC PANEL
Anion gap: 11 (ref 5–15)
BUN: 11 mg/dL (ref 6–20)
CO2: 29 mmol/L (ref 22–32)
Calcium: 10.5 mg/dL — ABNORMAL HIGH (ref 8.9–10.3)
Chloride: 100 mmol/L (ref 98–111)
Creatinine, Ser: 0.79 mg/dL (ref 0.61–1.24)
GFR calc Af Amer: >60 mL/min (ref >60)
GFR calc non Af Amer: >60 mL/min (ref >60)
Glucose, Bld: 95 mg/dL (ref 70–99)
Potassium: 4.4 mmol/L (ref 3.5–5.1)
Sodium: 140 mmol/L (ref 135–145)

## 2019-06-16 LAB — CBC
HCT: 41.2 % (ref 39.0–52.0)
Hemoglobin: 14.7 g/dL (ref 13.0–17.0)
MCH: 34.2 pg — ABNORMAL HIGH (ref 26.0–34.0)
MCHC: 35.7 g/dL (ref 30.0–36.0)
MCV: 95.8 fL (ref 80.0–100.0)
Platelets: 242 10*3/uL (ref 150–400)
RBC: 4.3 MIL/uL (ref 4.22–5.81)
RDW: 12.3 % (ref 11.5–15.5)
WBC: 5.8 10*3/uL (ref 4.0–10.5)
nRBC: 0 % (ref 0.0–0.2)

## 2019-06-16 LAB — SURGICAL PCR SCREEN
MRSA, PCR: NEGATIVE
Staphylococcus aureus: POSITIVE — AB

## 2019-06-16 LAB — TYPE AND SCREEN
ABO/RH(D): O POS
Antibody Screen: NEGATIVE

## 2019-06-16 LAB — HEMOGLOBIN A1C
Hgb A1c MFr Bld: 5.1 % (ref 4.8–5.6)
Mean Plasma Glucose: 99.67 mg/dL

## 2019-06-16 LAB — ABO/RH: ABO/RH(D): O POS

## 2019-06-16 LAB — GLUCOSE, CAPILLARY: Glucose-Capillary: 111 mg/dL — ABNORMAL HIGH (ref 70–99)

## 2019-06-16 NOTE — Progress Notes (Signed)
[place], [place] [address] Phone: [phone] Fax: [phone]      Your procedure is scheduled on Monday,  August 31st.  Report to [facility] Main Entrance "A" at 7:30 A.M., and check in at the Admitting office.  Call this number if you have problems the morning of surgery:  [phone]  Call [phone] if you have any questions prior to your surgery date Monday-Friday 8am-4pm    Remember:  Do not eat or drink after midnight the night before your surgery (Sun)     Take these medicines the morning of surgery with A SIP OF WATER (Mon)   Tylenol - if needed  Atorvastatin (lipitor)  Hydrocodone-Acetaminophen - if needed Flonase Nasal Spray if needed xyzal if needed   7 days prior to surgery STOP taking any Aspirin (unless otherwise instructed by your surgeon), Aleve, Naproxen, Ibuprofen, Motrin, Advil, Goody's, BC's, all herbal medications, fish oil, and all vitamins.   WHAT DO I DO ABOUT MY DIABETES MEDICATION?  Marland Kitchen Do not take oral diabetes medicines (pills) the morning of surgery. - Metformin   How to Manage Your Diabetes Before and After Surgery  Why is it important to control my blood sugar before and after surgery? . Improving blood sugar levels before and after surgery helps healing and can limit problems. . A way of improving blood sugar control is eating a healthy diet by: o  Eating less sugar and carbohydrates o  Increasing activity/exercise o  Talking with your doctor about reaching your blood sugar goals . High blood sugars (greater than 180 mg/dL) can raise your risk of infections and slow your recovery, so you will need to focus on controlling your diabetes during the weeks before surgery. . Make sure that the doctor who takes care of your diabetes knows about your planned surgery including the date and location.  How do I manage my blood sugar before surgery? . Check your blood  sugar at least 4 times a day, starting 2 days before surgery, to make sure that the level is not too high or low. o Check your blood sugar the morning of your surgery when you wake up and every 2 hours until you get to the Short Stay unit. . If your blood sugar is less than 70 mg/dL, you will need to treat for low blood sugar: o Do not take insulin. o Treat a low blood sugar (less than 70 mg/dL) with  cup of clear juice (cranberry or apple), 4 glucose tablets, OR glucose gel. o Recheck blood sugar in 15 minutes after treatment (to make sure it is greater than 70 mg/dL). If your blood sugar is not greater than 70 mg/dL on recheck, call [phone] for further instructions. . Report your blood sugar to the short stay nurse when you get to Short Stay.  . If you are admitted to the hospital after surgery: o Your blood sugar will be checked by the staff and you will probably be given insulin after surgery (instead of oral diabetes medicines) to make sure you have good blood sugar levels. o The goal for blood sugar control after surgery is 80-180 mg/dL.    The Morning of Surgery  Do not wear jewelry.  Do not wear lotions, powders, colognes, or deodorant  Do not shave 48 hours prior to surgery.  Men may shave face and neck.  Do not bring valuables to the hospital.  [facility] is  not responsible for any belongings or valuables.  If you are a smoker, DO NOT Smoke 24 hours prior to surgery.  IF you wear a CPAP at night please bring your mask, tubing, and machine the morning of surgery   Remember that you must have someone to transport you home after your surgery, and remain with you for 24 hours if you are discharged the same day.  Patients discharged the day of surgery will not be allowed to drive home.   Contacts, glasses, hearing aids, dentures or bridgework may not be worn into surgery.   For patients admitted to the hospital, discharge time will be determined by your treatment  team.    Special instructions:   [place]- Preparing For Surgery  Before surgery, you can play an important role. Because skin is not sterile, your skin needs to be as free of germs as possible. You can reduce the number of germs on your skin by washing with CHG (chlorahexidine gluconate) Soap before surgery.  CHG is an antiseptic cleaner which kills germs and bonds with the skin to continue killing germs even after washing.    Oral Hygiene is also important to reduce your risk of infection.  Remember - BRUSH YOUR TEETH THE MORNING OF SURGERY WITH YOUR REGULAR TOOTHPASTE  Please do not use if you have an allergy to CHG or antibacterial soaps. If your skin becomes reddened/irritated stop using the CHG.  Do not shave (including legs and underarms) for at least 48 hours prior to first CHG shower. It is OK to shave your face.  Please follow these instructions carefully.   1. Shower the [employer] BEFORE SURGERY (Sun) and the MORNING OF SURGERY (Mon) with CHG Soap.   2. If you chose to wash your hair, wash your hair first as usual with your normal shampoo.  3. After you shampoo, rinse your hair and body thoroughly to remove the shampoo.  4. Use CHG as you would any other liquid soap. You can apply CHG directly to the skin and wash gently with a scrungie or a clean washcloth.   5. Apply the CHG Soap to your body ONLY FROM THE NECK DOWN.  Do not use on open wounds or open sores. Avoid contact with your eyes, ears, mouth and genitals (private parts). Wash Face and genitals (private parts)  with your normal soap.   6. Wash thoroughly, paying special attention to the area where your surgery will be performed.  7. Thoroughly rinse your body with warm water from the neck down.  8. DO NOT shower/wash with your normal soap after using and rinsing off the CHG Soap.  9. Pat yourself dry with a CLEAN TOWEL.  10. Wear CLEAN PAJAMAS to bed the night before surgery, wear comfortable clothes the morning  of surgery  11. Place CLEAN SHEETS on your bed the night of your first shower and DO NOT SLEEP WITH PETS.   Day of Surgery:  Do not apply any deodorants/lotions. Please shower the morning of surgery with the CHG soap  Please wear clean clothes to the hospital/surgery center.   Remember to brush your teeth WITH YOUR REGULAR TOOTHPASTE.   Please read over the following fact sheets that you were given.

## 2019-06-16 NOTE — Progress Notes (Signed)
Patient denies shortness of breath, fever, cough and chest pain at PAT appointment  PCP - Dr Derinda Late Cardiologist - Denies Gastro-Dr Silvano Rusk  Chest x-ray - Denies EKG - 04/22/19- Copy in chart Stress Test - Denies ECHO - Denies Cardiac Cath - Denies  Sleep Study - Past History, lost 45 lbs, no longer needs CPAP. CPAP -  No  Fasting Blood Sugar -  Only checks once a week and not in the morning.  A1C today was 5.1. Checks Blood Sugar once a week.  Anesthesia review: Spoke with Jeneen Rinks, [place]. about pt's hx and hx of Hemochromatosis.  Anesthesia does not need to see chart.  7 days prior to surgery STOP taking any Aspirin (unless otherwise instructed by your surgeon), Aleve, Naproxen, Ibuprofen, Motrin, Advil, Goody's, BC's, all herbal medications, fish oil, and all vitamins.   Coronavirus Screening Have you or your mother experienced the following symptoms:  Cough yes/no: No Fever (>100.11F)  yes/no: No Runny nose yes/no: No Sore throat yes/no: No Difficulty breathing/shortness of breath  yes/no: No  Have you or your mother traveled in the last 14 days and where? yes/no: No

## 2019-06-24 ENCOUNTER — Other Ambulatory Visit (HOSPITAL_COMMUNITY)
Admission: RE | Admit: 2019-06-24 | Discharge: 2019-06-24 | Disposition: A | Discharge status: Home / Self Care | Payer: BC Managed Care – PPO | Source: Ambulatory Visit | Attending: Neurosurgery | Admitting: Neurosurgery

## 2019-06-24 DIAGNOSIS — Z20828 Contact with and (suspected) exposure to other viral communicable diseases: Secondary | ICD-10-CM | POA: Diagnosis not present

## 2019-06-24 DIAGNOSIS — M4316 Spondylolisthesis, lumbar region: Secondary | ICD-10-CM | POA: Insufficient documentation

## 2019-06-24 DIAGNOSIS — Z01812 Encounter for preprocedural laboratory examination: Secondary | ICD-10-CM | POA: Insufficient documentation

## 2019-06-24 LAB — SARS CORONAVIRUS 2 (TAT 6-24 HRS): SARS Coronavirus 2: NEGATIVE

## 2019-06-29 ENCOUNTER — Other Ambulatory Visit (HOSPITAL_COMMUNITY)
Admission: RE | Admit: 2019-06-29 | Discharge: 2019-06-29 | Disposition: A | Discharge status: Home / Self Care | Payer: BC Managed Care – PPO | Source: Ambulatory Visit | Attending: Neurosurgery | Admitting: Neurosurgery

## 2019-06-29 DIAGNOSIS — Z20828 Contact with and (suspected) exposure to other viral communicable diseases: Secondary | ICD-10-CM | POA: Insufficient documentation

## 2019-06-29 DIAGNOSIS — Z01812 Encounter for preprocedural laboratory examination: Secondary | ICD-10-CM | POA: Insufficient documentation

## 2019-06-29 LAB — SARS CORONAVIRUS 2 (TAT 6-24 HRS): SARS Coronavirus 2: NEGATIVE

## 2019-06-30 ENCOUNTER — Encounter (HOSPITAL_COMMUNITY): Payer: Self-pay

## 2019-06-30 ENCOUNTER — Inpatient Hospital Stay (HOSPITAL_COMMUNITY)
Admission: RE | Admit: 2019-06-30 | Discharge: 2019-07-01 | DRG: 460 | Disposition: A | Discharge status: Home / Self Care | Payer: BC Managed Care – PPO | Attending: Neurosurgery | Admitting: Neurosurgery

## 2019-06-30 ENCOUNTER — Inpatient Hospital Stay (HOSPITAL_COMMUNITY): Payer: BC Managed Care – PPO

## 2019-06-30 ENCOUNTER — Encounter (HOSPITAL_COMMUNITY)
Admission: RE | Disposition: A | Discharge status: Home / Self Care | Payer: Self-pay | Source: Home / Self Care | Attending: Neurosurgery

## 2019-06-30 ENCOUNTER — Inpatient Hospital Stay (HOSPITAL_COMMUNITY): Payer: BC Managed Care – PPO | Admitting: Certified Registered"

## 2019-06-30 ENCOUNTER — Other Ambulatory Visit: Payer: Self-pay

## 2019-06-30 DIAGNOSIS — F17290 Nicotine dependence, other tobacco product, uncomplicated: Secondary | ICD-10-CM | POA: Diagnosis present

## 2019-06-30 DIAGNOSIS — Z8349 Family history of other endocrine, nutritional and metabolic diseases: Secondary | ICD-10-CM | POA: Diagnosis not present

## 2019-06-30 DIAGNOSIS — M48061 Spinal stenosis, lumbar region without neurogenic claudication: Secondary | ICD-10-CM | POA: Diagnosis present

## 2019-06-30 DIAGNOSIS — E785 Hyperlipidemia, unspecified: Secondary | ICD-10-CM | POA: Diagnosis present

## 2019-06-30 DIAGNOSIS — Z833 Family history of diabetes mellitus: Secondary | ICD-10-CM

## 2019-06-30 DIAGNOSIS — M4316 Spondylolisthesis, lumbar region: Secondary | ICD-10-CM | POA: Diagnosis present

## 2019-06-30 DIAGNOSIS — Z8249 Family history of ischemic heart disease and other diseases of the circulatory system: Secondary | ICD-10-CM | POA: Diagnosis not present

## 2019-06-30 DIAGNOSIS — E119 Type 2 diabetes mellitus without complications: Secondary | ICD-10-CM | POA: Diagnosis present

## 2019-06-30 DIAGNOSIS — Z20828 Contact with and (suspected) exposure to other viral communicable diseases: Secondary | ICD-10-CM | POA: Diagnosis present

## 2019-06-30 DIAGNOSIS — Z419 Encounter for procedure for purposes other than remedying health state, unspecified: Secondary | ICD-10-CM

## 2019-06-30 LAB — GLUCOSE, CAPILLARY
Glucose-Capillary: 130 mg/dL — ABNORMAL HIGH (ref 70–99)
Glucose-Capillary: 68 mg/dL — ABNORMAL LOW (ref 70–99)
Glucose-Capillary: 113 mg/dL — ABNORMAL HIGH (ref 70–99)
Glucose-Capillary: 91 mg/dL (ref 70–99)
Glucose-Capillary: 111 mg/dL — ABNORMAL HIGH (ref 70–99)
Glucose-Capillary: 159 mg/dL — ABNORMAL HIGH (ref 70–99)

## 2019-06-30 SURGERY — POSTERIOR LUMBAR FUSION 1 LEVEL
Anesthesia: General | Site: Back

## 2019-06-30 MED ORDER — LIDOCAINE HCL (CARDIAC) PF 100 MG/5ML IV SOSY
PREFILLED_SYRINGE | INTRAVENOUS | Status: DC | PRN
  Administered 2019-06-30: 100 mg via INTRAVENOUS

## 2019-06-30 MED ORDER — DEXTROSE 50 % IV SOLN
12.5000 g | Freq: Once | INTRAVENOUS | Status: AC
  Administered 2019-06-30: 11:00:00 12.5 g via INTRAVENOUS

## 2019-06-30 MED ORDER — PANTOPRAZOLE SODIUM 40 MG PO TBEC
40.0000 mg | DELAYED_RELEASE_TABLET | Freq: Every day | ORAL | Status: DC
  Administered 2019-06-30: 40 mg via ORAL
  Filled 2019-06-30: qty 1

## 2019-06-30 MED ORDER — DEXAMETHASONE SODIUM PHOSPHATE 10 MG/ML IJ SOLN
INTRAMUSCULAR | Status: AC
  Filled 2019-06-30: qty 1

## 2019-06-30 MED ORDER — VITAMIN D 25 MCG (1000 UNIT) PO TABS
2000.0000 [IU] | ORAL_TABLET | Freq: Every day | ORAL | Status: DC
  Administered 2019-06-30: 2000 [IU] via ORAL
  Filled 2019-06-30: qty 2

## 2019-06-30 MED ORDER — HYDROMORPHONE HCL 1 MG/ML IJ SOLN
0.2500 mg | INTRAMUSCULAR | Status: DC | PRN
  Administered 2019-06-30 (×4): 0.5 mg via INTRAVENOUS

## 2019-06-30 MED ORDER — CHLORHEXIDINE GLUCONATE CLOTH 2 % EX PADS: 6.0000 | MEDICATED_PAD | Freq: Once | CUTANEOUS | Status: DC

## 2019-06-30 MED ORDER — HYDROMORPHONE HCL 1 MG/ML IJ SOLN
INTRAMUSCULAR | Status: AC
  Filled 2019-06-30: qty 2

## 2019-06-30 MED ORDER — SUGAMMADEX SODIUM 200 MG/2ML IV SOLN
INTRAVENOUS | Status: DC | PRN
  Administered 2019-06-30: 130 mg via INTRAVENOUS

## 2019-06-30 MED ORDER — SODIUM CHLORIDE 0.9 % IV SOLN
INTRAVENOUS | Status: DC | PRN
  Administered 2019-06-30: 13:00:00 20 ug/min via INTRAVENOUS

## 2019-06-30 MED ORDER — LORATADINE 10 MG PO TABS: 10.0000 mg | ORAL_TABLET | Freq: Every day | ORAL | Status: DC | PRN

## 2019-06-30 MED ORDER — FLUTICASONE PROPIONATE 50 MCG/ACT NA SUSP: 2.0000 | Freq: Every day | NASAL | Status: DC | PRN

## 2019-06-30 MED ORDER — OXYCODONE HCL 5 MG PO TABS
10.0000 mg | ORAL_TABLET | ORAL | Status: DC | PRN
  Administered 2019-06-30 – 2019-07-01 (×4): 10 mg via ORAL
  Filled 2019-06-30 (×4): qty 2

## 2019-06-30 MED ORDER — MIDAZOLAM HCL 2 MG/2ML IJ SOLN
INTRAMUSCULAR | Status: AC
  Filled 2019-06-30: qty 2

## 2019-06-30 MED ORDER — DIPHENHYDRAMINE-APAP (SLEEP) 25-500 MG PO TABS: 2.0000 | ORAL_TABLET | Freq: Every evening | ORAL | Status: DC | PRN

## 2019-06-30 MED ORDER — ACETAMINOPHEN 500 MG PO TABS: 1000.0000 mg | ORAL_TABLET | Freq: Four times a day (QID) | ORAL | Status: DC | PRN

## 2019-06-30 MED ORDER — ATORVASTATIN CALCIUM 10 MG PO TABS: 20.0000 mg | ORAL_TABLET | Freq: Every day | ORAL | Status: DC

## 2019-06-30 MED ORDER — LIDOCAINE-EPINEPHRINE 1 %-1:100000 IJ SOLN
INTRAMUSCULAR | Status: DC | PRN
  Administered 2019-06-30: 10 mL

## 2019-06-30 MED ORDER — THROMBIN 5000 UNITS EX SOLR
CUTANEOUS | Status: AC
  Filled 2019-06-30: qty 5000

## 2019-06-30 MED ORDER — MEPERIDINE HCL 25 MG/ML IJ SOLN: 6.2500 mg | INTRAMUSCULAR | Status: DC | PRN

## 2019-06-30 MED ORDER — CEFAZOLIN SODIUM-DEXTROSE 2-4 GM/100ML-% IV SOLN
2.0000 g | Freq: Three times a day (TID) | INTRAVENOUS | Status: AC
  Administered 2019-06-30 – 2019-07-01 (×2): 2 g via INTRAVENOUS
  Filled 2019-06-30 (×2): qty 100

## 2019-06-30 MED ORDER — ONDANSETRON HCL 4 MG/2ML IJ SOLN: 4.0000 mg | Freq: Four times a day (QID) | INTRAMUSCULAR | Status: DC | PRN

## 2019-06-30 MED ORDER — PHENYLEPHRINE HCL (PRESSORS) 10 MG/ML IV SOLN
INTRAVENOUS | Status: DC | PRN
  Administered 2019-06-30: 40 ug via INTRAVENOUS
  Administered 2019-06-30: 80 ug via INTRAVENOUS

## 2019-06-30 MED ORDER — PREGABALIN 75 MG PO CAPS
75.0000 mg | ORAL_CAPSULE | ORAL | Status: DC
  Administered 2019-06-30: 150 mg via ORAL
  Filled 2019-06-30 (×2): qty 1

## 2019-06-30 MED ORDER — THROMBIN 20000 UNITS EX SOLR
CUTANEOUS | Status: DC | PRN
  Administered 2019-06-30: 20 mL via TOPICAL

## 2019-06-30 MED ORDER — NABUMETONE 500 MG PO TABS
750.0000 mg | ORAL_TABLET | Freq: Every day | ORAL | Status: DC
  Filled 2019-06-30 (×2): qty 2

## 2019-06-30 MED ORDER — NORTRIPTYLINE HCL 25 MG PO CAPS
25.0000 mg | ORAL_CAPSULE | Freq: Every day | ORAL | Status: DC
  Administered 2019-06-30: 50 mg via ORAL
  Filled 2019-06-30: qty 2

## 2019-06-30 MED ORDER — THROMBIN 20000 UNITS EX SOLR
CUTANEOUS | Status: AC
  Filled 2019-06-30: qty 20000

## 2019-06-30 MED ORDER — LACTATED RINGERS IV SOLN
INTRAVENOUS | Status: DC | PRN
  Administered 2019-06-30 (×2): via INTRAVENOUS

## 2019-06-30 MED ORDER — PHENYLEPHRINE 40 MCG/ML (10ML) SYRINGE FOR IV PUSH (FOR BLOOD PRESSURE SUPPORT)
PREFILLED_SYRINGE | INTRAVENOUS | Status: AC
  Filled 2019-06-30: qty 10

## 2019-06-30 MED ORDER — PHENOL 1.4 % MT LIQD: 1.0000 | OROMUCOSAL | Status: DC | PRN

## 2019-06-30 MED ORDER — ONDANSETRON HCL 4 MG/2ML IJ SOLN
INTRAMUSCULAR | Status: DC | PRN
  Administered 2019-06-30: 4 mg via INTRAVENOUS

## 2019-06-30 MED ORDER — CEFAZOLIN SODIUM-DEXTROSE 2-3 GM-%(50ML) IV SOLR
INTRAVENOUS | Status: DC | PRN
  Administered 2019-06-30: 2 g via INTRAVENOUS

## 2019-06-30 MED ORDER — SODIUM CHLORIDE 0.9 % IV SOLN
INTRAVENOUS | Status: DC | PRN
  Administered 2019-06-30: 13:00:00 500 mL

## 2019-06-30 MED ORDER — LIDOCAINE-EPINEPHRINE 1 %-1:100000 IJ SOLN
INTRAMUSCULAR | Status: AC
  Filled 2019-06-30: qty 1

## 2019-06-30 MED ORDER — MENTHOL 3 MG MT LOZG: 1.0000 | LOZENGE | OROMUCOSAL | Status: DC | PRN

## 2019-06-30 MED ORDER — ROCURONIUM BROMIDE 50 MG/5ML IV SOSY
PREFILLED_SYRINGE | INTRAVENOUS | Status: DC | PRN
  Administered 2019-06-30 (×2): 30 mg via INTRAVENOUS
  Administered 2019-06-30: 50 mg via INTRAVENOUS

## 2019-06-30 MED ORDER — LIDOCAINE 2% (20 MG/ML) 5 ML SYRINGE
INTRAMUSCULAR | Status: AC
  Filled 2019-06-30: qty 5

## 2019-06-30 MED ORDER — BACLOFEN 10 MG PO TABS
10.0000 mg | ORAL_TABLET | Freq: Every day | ORAL | Status: DC | PRN
  Administered 2019-07-01: 02:00:00 10 mg via ORAL
  Filled 2019-06-30: qty 1

## 2019-06-30 MED ORDER — ACETAMINOPHEN 650 MG RE SUPP: 650.0000 mg | RECTAL | Status: DC | PRN

## 2019-06-30 MED ORDER — LEVOCETIRIZINE DIHYDROCHLORIDE 5 MG PO TABS: 5.0000 mg | ORAL_TABLET | Freq: Every day | ORAL | Status: DC | PRN

## 2019-06-30 MED ORDER — DEXTROSE 50 % IV SOLN
INTRAVENOUS | Status: AC
  Administered 2019-06-30: 12.5 g via INTRAVENOUS
  Filled 2019-06-30: qty 50

## 2019-06-30 MED ORDER — BUPIVACAINE LIPOSOME 1.3 % IJ SUSP
INTRAMUSCULAR | Status: DC | PRN
  Administered 2019-06-30: 20 mL

## 2019-06-30 MED ORDER — DEXAMETHASONE SODIUM PHOSPHATE 10 MG/ML IJ SOLN
10.0000 mg | Freq: Once | INTRAMUSCULAR | Status: AC
  Administered 2019-06-30: 10 mg via INTRAVENOUS

## 2019-06-30 MED ORDER — BACLOFEN 10 MG PO TABS: 10.0000 mg | ORAL_TABLET | Freq: Every day | ORAL | Status: DC | PRN

## 2019-06-30 MED ORDER — FENTANYL CITRATE (PF) 250 MCG/5ML IJ SOLN
INTRAMUSCULAR | Status: AC
  Filled 2019-06-30: qty 5

## 2019-06-30 MED ORDER — BUPIVACAINE HCL (PF) 0.25 % IJ SOLN
INTRAMUSCULAR | Status: AC
  Filled 2019-06-30: qty 30

## 2019-06-30 MED ORDER — PROPOFOL 10 MG/ML IV BOLUS
INTRAVENOUS | Status: DC | PRN
  Administered 2019-06-30: 150 mg via INTRAVENOUS

## 2019-06-30 MED ORDER — PANTOPRAZOLE SODIUM 40 MG IV SOLR: 40.0000 mg | Freq: Every day | INTRAVENOUS | Status: DC

## 2019-06-30 MED ORDER — SODIUM CHLORIDE 0.9% FLUSH: 3.0000 mL | Freq: Two times a day (BID) | INTRAVENOUS | Status: DC

## 2019-06-30 MED ORDER — HYDROMORPHONE HCL 1 MG/ML IJ SOLN: 0.5000 mg | INTRAMUSCULAR | Status: DC | PRN

## 2019-06-30 MED ORDER — METFORMIN HCL ER 500 MG PO TB24
1000.0000 mg | ORAL_TABLET | Freq: Two times a day (BID) | ORAL | Status: DC
  Administered 2019-07-01: 1000 mg via ORAL
  Filled 2019-06-30 (×2): qty 2

## 2019-06-30 MED ORDER — ONDANSETRON HCL 4 MG PO TABS: 4.0000 mg | ORAL_TABLET | Freq: Four times a day (QID) | ORAL | Status: DC | PRN

## 2019-06-30 MED ORDER — THROMBIN 5000 UNITS EX SOLR
OROMUCOSAL | Status: DC | PRN
  Administered 2019-06-30: 5 mL via TOPICAL

## 2019-06-30 MED ORDER — FENTANYL CITRATE (PF) 100 MCG/2ML IJ SOLN
INTRAMUSCULAR | Status: DC | PRN
  Administered 2019-06-30: 50 ug via INTRAVENOUS
  Administered 2019-06-30: 100 ug via INTRAVENOUS
  Administered 2019-06-30 (×2): 50 ug via INTRAVENOUS

## 2019-06-30 MED ORDER — ACETAMINOPHEN 325 MG PO TABS
650.0000 mg | ORAL_TABLET | ORAL | Status: DC | PRN
  Administered 2019-07-01: 650 mg via ORAL
  Filled 2019-06-30: qty 2

## 2019-06-30 MED ORDER — MIDAZOLAM HCL 5 MG/5ML IJ SOLN
INTRAMUSCULAR | Status: DC | PRN
  Administered 2019-06-30: 2 mg via INTRAVENOUS

## 2019-06-30 MED ORDER — PROMETHAZINE HCL 25 MG/ML IJ SOLN: 6.2500 mg | INTRAMUSCULAR | Status: DC | PRN

## 2019-06-30 MED ORDER — CYCLOBENZAPRINE HCL 10 MG PO TABS: 10.0000 mg | ORAL_TABLET | Freq: Three times a day (TID) | ORAL | Status: DC | PRN

## 2019-06-30 MED ORDER — HYDROCODONE-ACETAMINOPHEN 5-325 MG PO TABS: 1.0000 | ORAL_TABLET | Freq: Three times a day (TID) | ORAL | Status: DC | PRN

## 2019-06-30 MED ORDER — CEFAZOLIN SODIUM-DEXTROSE 2-4 GM/100ML-% IV SOLN
INTRAVENOUS | Status: AC
  Filled 2019-06-30: qty 100

## 2019-06-30 MED ORDER — 0.9 % SODIUM CHLORIDE (POUR BTL) OPTIME
TOPICAL | Status: DC | PRN
  Administered 2019-06-30 (×2): 1000 mL

## 2019-06-30 MED ORDER — SODIUM CHLORIDE 0.9% FLUSH: 3.0000 mL | INTRAVENOUS | Status: DC | PRN

## 2019-06-30 MED ORDER — PROPOFOL 10 MG/ML IV BOLUS
INTRAVENOUS | Status: AC
  Filled 2019-06-30: qty 20

## 2019-06-30 MED ORDER — SUCCINYLCHOLINE CHLORIDE 200 MG/10ML IV SOSY
PREFILLED_SYRINGE | INTRAVENOUS | Status: AC
  Filled 2019-06-30: qty 10

## 2019-06-30 MED ORDER — ONDANSETRON HCL 4 MG/2ML IJ SOLN
INTRAMUSCULAR | Status: AC
  Filled 2019-06-30: qty 2

## 2019-06-30 MED ORDER — ALUM & MAG HYDROXIDE-SIMETH 200-200-20 MG/5ML PO SUSP: 30.0000 mL | Freq: Four times a day (QID) | ORAL | Status: DC | PRN

## 2019-06-30 MED ORDER — CEFAZOLIN SODIUM-DEXTROSE 2-4 GM/100ML-% IV SOLN: 2.0000 g | INTRAVENOUS | Status: DC

## 2019-06-30 MED ORDER — SODIUM CHLORIDE 0.9 % IV SOLN: 250.0000 mL | INTRAVENOUS | Status: DC

## 2019-06-30 MED ORDER — BUPIVACAINE LIPOSOME 1.3 % IJ SUSP
20.0000 mL | Freq: Once | INTRAMUSCULAR | Status: DC
  Filled 2019-06-30: qty 20

## 2019-06-30 SURGICAL SUPPLY — 77 items
ADH SKN CLS APL DERMABOND .7 (GAUZE/BANDAGES/DRESSINGS) ×1
APL SKNCLS STERI-STRIP NONHPOA (GAUZE/BANDAGES/DRESSINGS) ×1
BAG DECANTER FOR FLEXI CONT (MISCELLANEOUS) ×3 IMPLANT
BASKET BONE COLLECTION (BASKET) ×2 IMPLANT
BENZOIN TINCTURE PRP APPL 2/3 (GAUZE/BANDAGES/DRESSINGS) ×3 IMPLANT
BLADE CLIPPER SURG (BLADE) ×2 IMPLANT
BLADE SURG 11 STRL SS (BLADE) ×3 IMPLANT
BONE VIVIGEN FORMABLE 5.4CC (Bone Implant) ×3 IMPLANT
BUR CUTTER 7.0 ROUND (BURR) ×3 IMPLANT
BUR MATCHSTICK NEURO 3.0 LAGG (BURR) ×3 IMPLANT
CANISTER SUCT 3000ML PPV (MISCELLANEOUS) ×3 IMPLANT
CAP LOCKING (Cap) ×8 IMPLANT
CAP LOCKING 5.5 CREO (Cap) IMPLANT
CARTRIDGE OIL MAESTRO DRILL (MISCELLANEOUS) ×1 IMPLANT
CLOSURE STERI-STRIP 1/2X4 (GAUZE/BANDAGES/DRESSINGS) ×1
CLOSURE WOUND 1/2 X4 (GAUZE/BANDAGES/DRESSINGS) ×2
CLSR STERI-STRIP ANTIMIC 1/2X4 (GAUZE/BANDAGES/DRESSINGS) ×2 IMPLANT
CONT SPEC 4OZ CLIKSEAL STRL BL (MISCELLANEOUS) ×3 IMPLANT
COVER BACK TABLE 60X90IN (DRAPES) ×3 IMPLANT
COVER WAND RF STERILE (DRAPES) ×3 IMPLANT
DECANTER SPIKE VIAL GLASS SM (MISCELLANEOUS) ×3 IMPLANT
DERMABOND ADVANCED (GAUZE/BANDAGES/DRESSINGS) ×2
DERMABOND ADVANCED .7 DNX12 (GAUZE/BANDAGES/DRESSINGS) ×1 IMPLANT
DIFFUSER DRILL AIR PNEUMATIC (MISCELLANEOUS) ×3 IMPLANT
DRAPE C-ARM 42X72 X-RAY (DRAPES) ×6 IMPLANT
DRAPE C-ARMOR (DRAPES) IMPLANT
DRAPE HALF SHEET 40X57 (DRAPES) IMPLANT
DRAPE LAPAROTOMY 100X72X124 (DRAPES) ×3 IMPLANT
DRAPE SURG 17X23 STRL (DRAPES) ×3 IMPLANT
DRSG OPSITE 4X5.5 SM (GAUZE/BANDAGES/DRESSINGS) ×1 IMPLANT
DRSG OPSITE POSTOP 4X6 (GAUZE/BANDAGES/DRESSINGS) ×3 IMPLANT
DURAPREP 26ML APPLICATOR (WOUND CARE) ×3 IMPLANT
ELECT REM PT RETURN 9FT ADLT (ELECTROSURGICAL) ×3
ELECTRODE REM PT RTRN 9FT ADLT (ELECTROSURGICAL) ×1 IMPLANT
EVACUATOR 3/16  PVC DRAIN (DRAIN)
EVACUATOR 3/16 PVC DRAIN (DRAIN) IMPLANT
GAUZE 4X4 16PLY RFD (DISPOSABLE) IMPLANT
GAUZE SPONGE 4X4 12PLY STRL (GAUZE/BANDAGES/DRESSINGS) ×1 IMPLANT
GLOVE BIO SURGEON STRL SZ7 (GLOVE) ×2 IMPLANT
GLOVE BIO SURGEON STRL SZ8 (GLOVE) ×6 IMPLANT
GLOVE BIOGEL PI IND STRL 7.0 (GLOVE) IMPLANT
GLOVE BIOGEL PI IND STRL 8 (GLOVE) IMPLANT
GLOVE BIOGEL PI INDICATOR 7.0 (GLOVE) ×6
GLOVE BIOGEL PI INDICATOR 8 (GLOVE) ×4
GLOVE ECLIPSE 7.5 STRL STRAW (GLOVE) ×6 IMPLANT
GLOVE EXAM NITRILE XL STR (GLOVE) IMPLANT
GLOVE INDICATOR 8.5 STRL (GLOVE) ×6 IMPLANT
GOWN STRL REUS W/ TWL LRG LVL3 (GOWN DISPOSABLE) IMPLANT
GOWN STRL REUS W/ TWL XL LVL3 (GOWN DISPOSABLE) ×2 IMPLANT
GOWN STRL REUS W/TWL 2XL LVL3 (GOWN DISPOSABLE) ×4 IMPLANT
GOWN STRL REUS W/TWL LRG LVL3 (GOWN DISPOSABLE) ×3
GOWN STRL REUS W/TWL XL LVL3 (GOWN DISPOSABLE) ×6
GRAFT BNE MATRIX VG FRMBL MD 5 (Bone Implant) IMPLANT
HEMOSTAT POWDER KIT SURGIFOAM (HEMOSTASIS) ×2 IMPLANT
KIT BASIN OR (CUSTOM PROCEDURE TRAY) ×3 IMPLANT
KIT TURNOVER KIT B (KITS) ×3 IMPLANT
MILL MEDIUM DISP (BLADE) ×3 IMPLANT
NEEDLE HYPO 25X1 1.5 SAFETY (NEEDLE) ×3 IMPLANT
NS IRRIG 1000ML POUR BTL (IV SOLUTION) ×6 IMPLANT
OIL CARTRIDGE MAESTRO DRILL (MISCELLANEOUS) ×3
PACK LAMINECTOMY NEURO (CUSTOM PROCEDURE TRAY) ×3 IMPLANT
PAD ARMBOARD 7.5X6 YLW CONV (MISCELLANEOUS) ×9 IMPLANT
ROD 40MM SPINAL (Rod) ×4 IMPLANT
SHAFT CREO 30MM (Neuro Prosthesis/Implant) ×8 IMPLANT
SPACER SUSTAIN TI 9X22X12 8D (Spacer) ×6 IMPLANT
SPONGE LAP 4X18 RFD (DISPOSABLE) IMPLANT
SPONGE SURGIFOAM ABS GEL 100 (HEMOSTASIS) ×3 IMPLANT
STRIP CLOSURE SKIN 1/2X4 (GAUZE/BANDAGES/DRESSINGS) ×4 IMPLANT
SUT VIC AB 0 CT1 18XCR BRD8 (SUTURE) ×2 IMPLANT
SUT VIC AB 0 CT1 8-18 (SUTURE) ×3
SUT VIC AB 2-0 CT1 18 (SUTURE) ×3 IMPLANT
SUT VIC AB 4-0 PS2 27 (SUTURE) ×3 IMPLANT
TOWEL GREEN STERILE (TOWEL DISPOSABLE) ×3 IMPLANT
TOWEL GREEN STERILE FF (TOWEL DISPOSABLE) ×3 IMPLANT
TRAY FOLEY MTR SLVR 16FR STAT (SET/KITS/TRAYS/PACK) ×3 IMPLANT
TULIP CREP AMP 5.5MM (Orthopedic Implant) ×8 IMPLANT
WATER STERILE IRR 1000ML POUR (IV SOLUTION) ×3 IMPLANT

## 2019-06-30 NOTE — Transfer of Care (Signed)
Immediate Anesthesia Transfer of Care Note  Patient: [name]  Procedure(s) Performed: POSTERIOR LUMBAR INTERBODY FUSION-LUMBAR FOUR-LUMBAR FIVE (N/A Back)  Patient Location: PACU  Anesthesia Type:General  Level of Consciousness: drowsy and patient cooperative  Airway & Oxygen Therapy: Patient Spontanous Breathing and Patient connected to face mask oxygen  Post-op Assessment: Report given to RN, Post -op Vital signs reviewed and stable and Patient moving all extremities  Post vital signs: Reviewed and stable  Last Vitals:  Vitals Value Taken Time  BP 129/75 06/30/19 1458  Temp    Pulse 79 06/30/19 1459  Resp 13 06/30/19 1459  SpO2 100 % 06/30/19 1459  Vitals shown include unvalidated device data.  Last Pain:  Vitals:   06/30/19 1053  TempSrc: Oral  PainSc:       Patients Stated Pain Goal: 2 ([id number])  Complications: No apparent anesthesia complications

## 2019-06-30 NOTE — Op Note (Signed)
Preoperative diagnosis: Grade 1 spondylolisthesis L4-5  Postoperative diagnosis: Same  Procedure: Posterior lumbar interbody fusion L4-5 utilizing the globus insert and rotate titanium cages packed with locally harvested autograft mixed with vivigen  2.  Cortical screw fixation L4-5 utilizing globus Creo modular cortical screw set  Surgeon: Dominica Severin Nikky Duba  Assistant: Nash Shearer  Anesthesia: General  EBL: Minimal  HPI: 53 year old gentleman has had longstanding back and bilateral hip and leg pain rating down L4 and L5 nerve root pattern work-up revealed a grade 1 spondylolisthesis with diastases of his facet joints and movement on flexion-extension films.  Due to patient's failed conservative treatment imaging findings and progression of clinical syndrome I recommended a decompression posterior lumbar interbody fusion.  I extensively went over the risks and benefits of the operation with him as well as perioperative course expectations of outcome and alternatives of surgery and he understood and agreed to proceed forward.  Operative procedure: Patient brought into the OR was used in general anesthesia positioned prone on the Wilson frame his back was prepped and draped in routine sterile fashion preoperative x-ray localized the appropriate level so after infiltration of 10 cc lidocaine with epi midline incision was made and Bovie elect cautery was used take down subtenons tissue and subperiosteal dissection was carried lamina of L4 and L5.  Intraoperative x-ray confirmed identification appropriate level so preserving the spinous process and interspinous ligament bilateral laminotomies with complete medial facetectomies were performed and foraminotomies of the L4 and L5 nerve roots.  Aggressively under biting the superior to collating process allowed access to the lateral aspect of the space.  Then this space was then coagulated and incised cleaned out bilaterally utilizing sequential distraction  large central disc removed with Epstein curettes Scovil curettes and paddle shavers the endplates were prepared and the discectomy was performed.  Under fluoroscopy I placed to insert and rotate cages 8 degrees and packed an extensive manner locally harvested autograft centrally between the cages.  Then all above all 4 cortical screws were placed under fluoroscopy they all had excellent purchase and fluoroscopy confirmed good position.  Then 2 rods were placed the anchoring knot was anchored in place on L5 the L4 screw was compressed against L5 the foramina reinspected to confirm patency and Gelfoam was overlaid top of dura than the muscle fascia reapproximated layers with optic Vicryl injecting Exparel in the fascia.  And the skin was closed running 4 subcuticular Dermabond benzoin Steri-Strips were applied patient recovery in stable condition.  At the end the case on needle count sponge counts were correct.

## 2019-06-30 NOTE — Anesthesia Preprocedure Evaluation (Signed)
Anesthesia Evaluation  Patient identified by MRN, date of birth, ID band Patient awake    Reviewed: Allergy & Precautions, NPO status , Patient's Chart, lab work & pertinent test results  Airway Mallampati: II  TM Distance: >3 FB Neck ROM: Full    Dental  (+) Dental Advisory Given   Pulmonary sleep apnea ,    Pulmonary exam normal breath sounds clear to auscultation       Cardiovascular negative cardio ROS Normal cardiovascular exam Rhythm:Regular Rate:Normal     Neuro/Psych PSYCHIATRIC DISORDERS Depression  Neuromuscular disease    GI/Hepatic negative GI ROS, Neg liver ROS,   Endo/Other  diabetes  Renal/GU negative Renal ROS     Musculoskeletal  (+) Arthritis , Fibromyalgia -  Abdominal   Peds  Hematology  (+) Blood dyscrasia, anemia ,   Anesthesia Other Findings   Reproductive/Obstetrics negative OB ROS                             Anesthesia Physical Anesthesia Plan  ASA: II  Anesthesia Plan: General   Post-op Pain Management:    Induction: Intravenous  PONV Risk Score and Plan: 3 and Ondansetron, Dexamethasone and Treatment may vary due to age or medical condition  Airway Management Planned: Oral ETT  Additional Equipment:   Intra-op Plan:   Post-operative Plan: Extubation in OR  Informed Consent: I have reviewed the patients History and Physical, chart, labs and discussed the procedure including the risks, benefits and alternatives for the proposed anesthesia with the patient or authorized representative who has indicated his/her understanding and acceptance.     Dental advisory given  Plan Discussed with: CRNA  Anesthesia Plan Comments:         Anesthesia Quick Evaluation

## 2019-06-30 NOTE — Progress Notes (Signed)
CBG @ 1055 - 68  Per protocol, gave pt 12.5 g of 50% Dextrose.  Will recheck CBG in 15 minutes.

## 2019-06-30 NOTE — H&P (Signed)
[name] is an 53 y.o. male.   Chief Complaint: Back and leg pain HPI: 53 year old gentleman with longstanding back bilateral leg pain rating down L4-L5 nerve root pattern.  Work-up revealed severe spinal stenosis L4-5 with instability on flexion-extension films.  Due to patient's failed conservative treatment imaging findings and progression of clinical syndrome I recommended decompressive laminotomy and interbody fusion at L4-5.  I extensively went over the risks and benefits of the procedure with him as well as perioperative course expectations of outcome and alternatives of surgery and he understands and agrees to proceed forward.  Past Medical History:  Diagnosis Date  . Allergy   . Anemia   . Blood dyscrasia    hemachromatosis  . DDD (degenerative disc disease), cervical   . DDD (degenerative disc disease), lumbar   . Depression   . Diabetes mellitus without complication ([place])    type 2  . Fibromyalgia   . Folliculitis   . Hemochromatosis    followed by Dr [name]  . Hyperlipidemia   . Neuromuscular disorder ([place])    occasional neuropathy in left foot  . Sleep apnea    past hx- has lost 45lbs- no longer needs CPAP    Past Surgical History:  Procedure Laterality Date  . broken nose surgery    . COLONOSCOPY    . NASAL SEPTUM SURGERY    . UPPER GI ENDOSCOPY    . WISDOM TOOTH EXTRACTION      Family History  Problem Relation Age of Onset  . Hypertension Mother   . Diabetes Mother   . Hyperlipidemia Mother   . Hyperlipidemia Father   . Hypertension Father   . Colon cancer Neg Hx   . Colon polyps Neg Hx   . Esophageal cancer Neg Hx   . Rectal cancer Neg Hx   . Stomach cancer Neg Hx    Social History:  reports that he has never smoked. His smokeless tobacco use includes snuff. He reports current alcohol use. He reports that he does not use drugs.  Allergies: No Known Allergies  Medications Prior to Admission  Medication Sig Dispense Refill  . acetaminophen  (TYLENOL) 500 MG tablet Take 1,000 mg by mouth every 6 (six) hours as needed for moderate pain.     Marland Kitchen atorvastatin (LIPITOR) 20 MG tablet Take 20 mg by mouth daily.     . baclofen (LIORESAL) 10 MG tablet Take 10-20 mg by mouth daily as needed for muscle spasms.     . Cholecalciferol (VITAMIN D3) 50 MCG (2000 UT) TABS Take 2,000 Units by mouth daily.     . diphenhydramine-acetaminophen (TYLENOL PM) 25-500 MG TABS tablet Take 2 tablets by mouth at bedtime as needed (sleep).     . fluticasone (FLONASE) 50 MCG/ACT nasal spray Place 2 sprays into both nostrils daily as needed for allergies.     Marland Kitchen HYDROcodone-acetaminophen (NORCO/VICODIN) 5-325 MG tablet Take 1 tablet by mouth 3 (three) times daily as needed for moderate pain.     Marland Kitchen levocetirizine (XYZAL) 5 MG tablet Take 5 mg by mouth daily as needed for allergies.     . metFORMIN (GLUCOPHAGE-XR) 500 MG 24 hr tablet Take 1,000 mg by mouth 2 (two) times daily with breakfast and lunch.     . nabumetone (RELAFEN) 750 MG tablet Take 750 mg by mouth daily.    . nortriptyline (PAMELOR) 25 MG capsule Take 25-50 mg by mouth at bedtime.     . Omega-3 Fatty Acids (FISH OIL) 1000 MG  CAPS Take 1,000 mg by mouth 3 (three) times daily.     . pregabalin (LYRICA) 75 MG capsule Take 75-150 mg by mouth See admin instructions. Take 75 mg by mouth at lunch and 150 mg at bedtime      Results for orders placed or performed during the hospital encounter of 06/30/19 (from the past 48 hour(s))  Glucose, capillary     Status: Abnormal   Collection Time: 06/30/19 10:55 AM  Result Value Ref Range   Glucose-Capillary 68 (L) 70 - 99 mg/dL  Glucose, capillary     Status: Abnormal   Collection Time: 06/30/19 11:29 AM  Result Value Ref Range   Glucose-Capillary 130 (H) 70 - 99 mg/dL   Comment 1 Notify RN    Comment 2 Document in Chart    No results found.  Review of Systems  Musculoskeletal: Positive for back pain.  Neurological: Positive for tingling and sensory change.     Blood pressure (!) 152/74, pulse 76, temperature 98.1 F (36.7 C), temperature source Oral, resp. rate 20, height 5' 6.5" (1.689 m), weight 65 kg, SpO2 100 %. Physical Exam  Constitutional: He appears well-developed.  HENT:  Head: Normocephalic.  Eyes: Pupils are equal, round, and reactive to light.  Neck: Normal range of motion.  Cardiovascular: Normal rate.  Respiratory: Effort normal.  GI: Soft.  Neurological: He is alert. He has normal strength. GCS eye subscore is 4. GCS verbal subscore is 5. GCS motor subscore is 6.  Strength 5 out of 5 iliopsoas, quads, hamstrings, gastroc, into tibialis, and EHL.  Skin: Skin is warm.     Assessment/Plan 53 year old gentleman presents for decompression interbody fusion L4-5  Brian Sprung P, MD 06/30/2019, 12:20 PM

## 2019-06-30 NOTE — Anesthesia Procedure Notes (Signed)
Procedure Name: Intubation Date/Time: 06/30/2019 12:30 PM Performed by: Lavell Luster, CRNA Pre-anesthesia Checklist: Patient identified, Emergency Drugs available, Suction available, Patient being monitored and Timeout performed Patient Re-evaluated:Patient Re-evaluated prior to induction Oxygen Delivery Method: Circle system utilized Preoxygenation: Pre-oxygenation with 100% oxygen Induction Type: IV induction Ventilation: Mask ventilation without difficulty Laryngoscope Size: Mac and 4 Grade View: Grade I Tube type: Oral Tube size: 7.5 mm Number of attempts: 1 Airway Equipment and Method: Stylet Placement Confirmation: ETT inserted through vocal cords under direct vision,  positive ETCO2 and breath sounds checked- equal and bilateral Secured at: 22 cm Tube secured with: Tape Dental Injury: Teeth and Oropharynx as per pre-operative assessment

## 2019-07-01 LAB — GLUCOSE, CAPILLARY: Glucose-Capillary: 130 mg/dL — ABNORMAL HIGH (ref 70–99)

## 2019-07-01 NOTE — Progress Notes (Signed)
Patient is discharged from room 3C06 at this time. Alert and in stable condition. IV site d/c'd and instructions read to patient with understanding verbalized. Left unit via wheelchair with all belongings at side.  

## 2019-07-01 NOTE — Anesthesia Postprocedure Evaluation (Signed)
Anesthesia Post Note  Patient: [name]  Procedure(s) Performed: POSTERIOR LUMBAR INTERBODY FUSION-LUMBAR FOUR-LUMBAR FIVE (N/A Back)     Patient location during evaluation: PACU Anesthesia Type: General Level of consciousness: sedated and patient cooperative Pain management: pain level controlled Vital Signs Assessment: post-procedure vital signs reviewed and stable Respiratory status: spontaneous breathing Cardiovascular status: stable Anesthetic complications: no    Last Vitals:  Vitals:   07/01/19 0345 07/01/19 0801  BP: (!) 154/91 (!) 142/72  Pulse: 76 65  Resp: 18 18  Temp: 36.7 C 36.4 C  SpO2: 100% 100%    Last Pain:  Vitals:   07/01/19 0801  TempSrc: Oral  PainSc:                  Nolon Nations

## 2019-07-01 NOTE — Discharge Instructions (Signed)

## 2019-07-01 NOTE — Evaluation (Signed)
Physical Therapy Evaluation and Discharge Patient Details Name: [name] MRN: [id number] DOB: 1966/08/06 Today's Date: 07/01/2019   History of Present Illness  Pt is a 53 y/o male who presents s/p L4-L5 PLIF on 06/30/2019. 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  Clinical Impression  Patient evaluated by Physical Therapy with no further acute PT needs identified. All education has been completed and the patient has no further questions. At the time of PT eval pt was able to perform transfers and ambulation with gross modified independence and no AD. Pt was educated on precautions, brace application/wearing schedule, activity progression, and car transfer. See below for any follow-up Physical Therapy or equipment needs. PT is signing off. Thank you for this referral.     Follow Up Recommendations No PT follow up;Supervision - Intermittent    Equipment Recommendations  None recommended by PT    Recommendations for Other Services       Precautions / Restrictions Precautions Precautions: Back Precaution Booklet Issued: Yes (comment) Precaution Comments: Reviewed in detail and pt was cued for maintenance of precautions during functional mobility.  Required Braces or Orthoses: Spinal Brace Spinal Brace: Lumbar corset;Applied in sitting position Restrictions Weight Bearing Restrictions: No      Mobility  Bed Mobility Overal bed mobility: Modified Independent             General bed mobility comments: Pt able to perform mod I   Transfers Overall transfer level: Modified independent Equipment used: None             General transfer comment: Increased time due to pain. Pt was cued for wide BOS and to scoot to edge of bed for ease of transfer.   Ambulation/Gait Ambulation/Gait assistance: Modified independent (Device/Increase time) Gait Distance (Feet): 250 Feet Assistive device: None Gait Pattern/deviations: Step-through pattern;Decreased  stride length Gait velocity: Decreased Gait velocity interpretation: 1.31 - 2.62 ft/sec, indicative of limited community ambulator General Gait Details: Occasional VC's for improved posture. Pt was able to ambulate fairly well without unsteadiness or LOB noted.   Stairs Stairs: Yes Stairs assistance: Modified independent (Device/Increase time) Stair Management: One rail Right;Alternating pattern;Forwards Number of Stairs: 10 General stair comments: VC's for sequencing and general safety.   Wheelchair Mobility    Modified Rankin (Stroke Patients Only)       Balance Overall balance assessment: No apparent balance deficits (not formally assessed)                                           Pertinent Vitals/Pain Pain Assessment: Faces Faces Pain Scale: Hurts little more Pain Location: Incision site Pain Descriptors / Indicators: Operative site guarding;Grimacing Pain Intervention(s): Monitored during session;Repositioned    Home Living Family/patient expects to be discharged to:: Private residence Living Arrangements: Parent;Other relatives Available Help at Discharge: Family;Available 24 hours/day Type of Home: House Home Access: Stairs to enter Entrance Stairs-Rails: None Entrance Stairs-Number of Steps: 2 Home Layout: One level Home Equipment: Bedside commode;Shower seat;Toilet riser;Adaptive equipment Additional Comments: his mother and sister will be assisting hime     Prior Function Level of Independence: Independent         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 [employer]        Extremity/Trunk Assessment   Upper Extremity Assessment Upper Extremity Assessment: Overall WFL for tasks assessed  Lower Extremity Assessment Lower Extremity Assessment: Defer to PT evaluation    Cervical / Trunk Assessment Cervical / Trunk Assessment: Other exceptions Cervical / Trunk Exceptions: s/p surgery  Communication    Communication: No difficulties  Cognition Arousal/Alertness: Awake/alert Behavior During Therapy: WFL for tasks assessed/performed Overall Cognitive Status: Within Functional Limits for tasks assessed                                        General Comments      Exercises     Assessment/Plan    PT Assessment Patent does not need any further PT services  PT Problem List         PT Treatment Interventions      PT Goals (Current goals can be found in the Care Plan section)  Acute Rehab PT Goals Patient Stated Goal: to get back to normal  PT Goal Formulation: All assessment and education complete, DC therapy    Frequency     Barriers to discharge        Co-evaluation               AM-PAC PT "6 Clicks" Mobility  Outcome Measure Help needed turning from your back to your side while in a flat bed without using bedrails?: None Help needed moving from lying on your back to sitting on the side of a flat bed without using bedrails?: None Help needed moving to and from a bed to a chair (including a wheelchair)?: None Help needed standing up from a chair using your arms (e.g., wheelchair or bedside chair)?: None Help needed to walk in hospital room?: None Help needed climbing 3-5 steps with a railing? : None 6 Click Score: 24    End of Session Equipment Utilized During Treatment: Gait belt;Back brace Activity Tolerance: Patient tolerated treatment well Patient left: with call bell/phone within reach(Sitting EOB awaiting OT) Nurse Communication: Mobility status PT Visit Diagnosis: Pain;Other symptoms and signs involving the nervous system (R29.898) Pain - part of body: (back)    Time: [id number] PT Time Calculation (min) (ACUTE ONLY): 15 min   Charges:   PT Evaluation $PT Eval Low Complexity: 1 Low          [name], PT, DPT Acute Rehabilitation Services Pager: [phone] Office: [phone]   [name] 07/01/2019, 10:50  AM

## 2019-07-01 NOTE — Evaluation (Signed)
Occupational Therapy Evaluation Patient Details Name: [name] MRN: [id number] DOB: 10/21/1966 Today's Date: 07/01/2019    History of Present Illness Pt is a 53 y/o male who presents s/p L4-L5 PLIF on 06/30/2019. 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   Clinical Impression   Patient evaluated by Occupational Therapy with no further acute OT needs identified. All education has been completed and the patient has no further questions. All education completed.  See below for any follow-up Occupational Therapy or equipment needs. OT is signing off. Thank you for this referral.      Follow Up Recommendations  No OT follow up;Supervision - Intermittent    Equipment Recommendations  None recommended by OT    Recommendations for Other Services       Precautions / Restrictions Precautions Precautions: Back Precaution Booklet Issued: Yes (comment) Precaution Comments: Reviewed in detail and pt was cued for maintenance of precautions during functional mobility.  Required Braces or Orthoses: Spinal Brace Spinal Brace: Lumbar corset;Applied in sitting position Restrictions Weight Bearing Restrictions: No      Mobility Bed Mobility Overal bed mobility: Modified Independent             General bed mobility comments: Pt able to perform mod I   Transfers Overall transfer level: Modified independent Equipment used: None             General transfer comment: Increased time due to pain. Pt was cued for wide BOS and to scoot to edge of bed for ease of transfer.     Balance Overall balance assessment: No apparent balance deficits (not formally assessed)                                         ADL either performed or assessed with clinical judgement   ADL Overall ADL's : Needs assistance/impaired Eating/Feeding: Independent   Grooming: Wash/dry hands;Wash/dry face;Oral care;Modified independent;Standing Grooming Details  (indicate cue type and reason): reviewed safe technique for grooming and shaving  Upper Body Bathing: Modified independent;Standing   Lower Body Bathing: Modified independent;Sit to/from stand Lower Body Bathing Details (indicate cue type and reason): reviewed use of LH sponge/brush  Upper Body Dressing : Modified independent;Sitting   Lower Body Dressing: Modified independent;Sit to/from stand Lower Body Dressing Details (indicate cue type and reason): able to perform figure 4  Toilet Transfer: Modified Independent;Ambulation;Comfort height toilet   Toileting- Clothing Manipulation and Hygiene: Modified independent;Sit to/from stand   Tub/ Banker: Walk-in shower;Modified independent;Ambulation   Functional mobility during ADLs: Modified independent General ADL Comments: reviewed safety with IADLs      Vision         Perception     Praxis      Pertinent Vitals/Pain Pain Assessment: Faces Faces Pain Scale: Hurts little more Pain Location: Incision site Pain Descriptors / Indicators: Operative site guarding;Grimacing Pain Intervention(s): Monitored during session;Repositioned     Hand Dominance     Extremity/Trunk Assessment Upper Extremity Assessment Upper Extremity Assessment: Overall WFL for tasks assessed   Lower Extremity Assessment Lower Extremity Assessment: Defer to PT evaluation   Cervical / Trunk Assessment Cervical / Trunk Assessment: Other exceptions Cervical / Trunk Exceptions: s/p surgery   Communication Communication Communication: No difficulties   Cognition Arousal/Alertness: Awake/alert Behavior During Therapy: WFL for tasks assessed/performed Overall Cognitive Status: Within Functional Limits for tasks assessed  General Comments       Exercises     Shoulder Instructions      Home Living Family/patient expects to be discharged to:: Private residence Living Arrangements:  Parent;Other relatives Available Help at Discharge: Family;Available 24 hours/day Type of Home: House Home Access: Stairs to enter [employer] of Steps: 2 Entrance Stairs-Rails: None Home Layout: One level     Bathroom Shower/Tub: Tub/shower unit;Walk-in shower   Bathroom Toilet: Standard     Home Equipment: Bedside commode;Shower seat;Toilet riser;Adaptive equipment Adaptive Equipment: Reacher Additional Comments: his mother and sister will be assisting hime       Prior Functioning/Environment Level of Independence: Independent        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         OT Problem List: Decreased activity tolerance;Impaired balance (sitting and/or standing);Decreased knowledge of precautions;Pain;Decreased knowledge of use of DME or AE      OT Treatment/Interventions:      OT Goals(Current goals can be found in the care plan section) Acute Rehab OT Goals Patient Stated Goal: to get back to normal  OT Goal Formulation: All assessment and education complete, DC therapy Potential to Achieve Goals: Good  OT Frequency:     Barriers to D/C:            Co-evaluation              AM-PAC OT "6 Clicks" Daily Activity     Outcome Measure Help from another person eating meals?: None Help from another person taking care of personal grooming?: None Help from another person toileting, which includes using toliet, bedpan, or urinal?: None Help from another person bathing (including washing, rinsing, drying)?: None Help from another person to put on and taking off regular upper body clothing?: None Help from another person to put on and taking off regular lower body clothing?: None 6 Click Score: 24   End of Session Equipment Utilized During Treatment: Back brace Nurse Communication: Mobility status  Activity Tolerance: Patient tolerated treatment well Patient left: in chair;with call bell/phone within reach  OT Visit Diagnosis:  Pain Pain - part of body: (back )                Time: [id number] OT Time Calculation (min): 24 min Charges:  OT General Charges $OT Visit: 1 Visit OT Evaluation $OT Eval Low Complexity: 1 Low OT Treatments $Self Care/Home Management : 8-22 mins  Lucille Passy, OTR/L Acute Rehabilitation Services Pager [phone] Office [phone]   Lucille Passy M 07/01/2019, 9:48 AM

## 2019-07-01 NOTE — Discharge Summary (Signed)
Physician Discharge Summary  Patient ID: [name] MRN: [id number] DOB/AGE: 1965-11-27 53 y.o. Estimated body mass index is 22.77 kg/m as calculated from the following:   Height as of this encounter: 5' 6.5" (1.689 m).   Weight as of this encounter: 65 kg.   Admit date: 06/30/2019 Discharge date: 07/01/2019  Admission Diagnoses: Grade 1 spondylolisthesis L4-5  Discharge Diagnoses: Same Active Problems:   Spondylolisthesis at L4-L5 level   Discharged Condition: good  Hospital Course: Patient is admitted to the hospital underwent decompressive laminectomy and interbody fusion at L4-5.  Postoperative patient did very well and covering the floor on the floor was ambulating and voiding spontaneously tolerating regular diet was stable for discharge home.  Patient will be discharged scheduled follow-up in 2 weeks  Consults: Significant Diagnostic Studies: Treatments:Posterior lumbar interbody fusion L4-5 Discharge Exam: Blood pressure (!) 154/91, pulse 76, temperature 98 F (36.7 C), temperature source Oral, resp. rate 18, height 5' 6.5" (1.689 m), weight 65 kg, SpO2 100 %. Strength 5-5 wound clean dry and intact  Disposition: Home   Allergies as of 07/01/2019   No Known Allergies     Medication List    TAKE these medications   acetaminophen 500 MG tablet Commonly known as: TYLENOL Take 1,000 mg by mouth every 6 (six) hours as needed for moderate pain.   atorvastatin 20 MG tablet Commonly known as: LIPITOR Take 20 mg by mouth daily.   baclofen 10 MG tablet Commonly known as: LIORESAL Take 10-20 mg by mouth daily as needed for muscle spasms.   diphenhydramine-acetaminophen 25-500 MG Tabs tablet Commonly known as: TYLENOL PM Take 2 tablets by mouth at bedtime as needed (sleep).   Fish Oil 1000 MG Caps Take 1,000 mg by mouth 3 (three) times daily.   fluticasone 50 MCG/ACT nasal spray Commonly known as: FLONASE Place 2 sprays into both nostrils daily as needed for  allergies.   HYDROcodone-acetaminophen 5-325 MG tablet Commonly known as: NORCO/VICODIN Take 1 tablet by mouth 3 (three) times daily as needed for moderate pain.   levocetirizine 5 MG tablet Commonly known as: XYZAL Take 5 mg by mouth daily as needed for allergies.   metFORMIN 500 MG 24 hr tablet Commonly known as: GLUCOPHAGE-XR Take 1,000 mg by mouth 2 (two) times daily with breakfast and lunch.   nabumetone 750 MG tablet Commonly known as: RELAFEN Take 750 mg by mouth daily.   nortriptyline 25 MG capsule Commonly known as: PAMELOR Take 25-50 mg by mouth at bedtime.   pregabalin 75 MG capsule Commonly known as: LYRICA Take 75-150 mg by mouth See admin instructions. Take 75 mg by mouth at lunch and 150 mg at bedtime   Vitamin D3 50 MCG (2000 UT) Tabs Take 2,000 Units by mouth daily.        Signed: Garry Nicolini P 07/01/2019, 7:59 AM

## 2019-08-03 ENCOUNTER — Other Ambulatory Visit (INDEPENDENT_AMBULATORY_CARE_PROVIDER_SITE_OTHER): Payer: BC Managed Care – PPO

## 2019-08-03 LAB — CBC
HCT: 37.5 % — ABNORMAL LOW (ref 39.0–52.0)
Hemoglobin: 13 g/dL (ref 13.0–17.0)
MCHC: 34.8 g/dL (ref 30.0–36.0)
MCV: 96.7 fl (ref 78.0–100.0)
Platelets: 246 10*3/uL (ref 150.0–400.0)
RBC: 3.88 Mil/uL — ABNORMAL LOW (ref 4.22–5.81)
RDW: 12.9 % (ref 11.5–15.5)
WBC: 5.5 10*3/uL (ref 4.0–10.5)

## 2019-08-03 LAB — FERRITIN: Ferritin: 11 ng/mL — ABNORMAL LOW (ref 22.0–322.0)

## 2019-08-05 ENCOUNTER — Other Ambulatory Visit: Payer: Self-pay

## 2019-08-05 NOTE — Progress Notes (Signed)
Phlebotomy in 3 months again

## 2019-11-02 ENCOUNTER — Other Ambulatory Visit: Payer: Self-pay | Admitting: Internal Medicine

## 2019-11-02 ENCOUNTER — Other Ambulatory Visit (INDEPENDENT_AMBULATORY_CARE_PROVIDER_SITE_OTHER): Payer: BC Managed Care – PPO

## 2019-11-02 LAB — CBC WITH DIFFERENTIAL/PLATELET
Basophils Absolute: 0 10*3/uL (ref 0.0–0.1)
Basophils Relative: 0.6 % (ref 0.0–3.0)
Eosinophils Absolute: 0.1 10*3/uL (ref 0.0–0.7)
Eosinophils Relative: 2.1 % (ref 0.0–5.0)
HCT: 41.1 % (ref 39.0–52.0)
Hemoglobin: 13.9 g/dL (ref 13.0–17.0)
Lymphocytes Relative: 26.8 % (ref 12.0–46.0)
Lymphs Abs: 1.3 10*3/uL (ref 0.7–4.0)
MCHC: 33.9 g/dL (ref 30.0–36.0)
MCV: 91.9 fl (ref 78.0–100.0)
Monocytes Absolute: 0.5 10*3/uL (ref 0.1–1.0)
Monocytes Relative: 9.8 % (ref 3.0–12.0)
Neutro Abs: 3 10*3/uL (ref 1.4–7.7)
Neutrophils Relative %: 60.7 % (ref 43.0–77.0)
Platelets: 215 10*3/uL (ref 150.0–400.0)
RBC: 4.47 Mil/uL (ref 4.22–5.81)
RDW: 13.5 % (ref 11.5–15.5)
WBC: 4.9 10*3/uL (ref 4.0–10.5)

## 2019-11-04 ENCOUNTER — Other Ambulatory Visit: Payer: Self-pay

## 2019-11-16 ENCOUNTER — Other Ambulatory Visit: Payer: Self-pay | Admitting: *Deleted

## 2019-11-17 ENCOUNTER — Encounter: Payer: Self-pay | Admitting: *Deleted

## 2020-02-04 ENCOUNTER — Other Ambulatory Visit (INDEPENDENT_AMBULATORY_CARE_PROVIDER_SITE_OTHER): Payer: BC Managed Care – PPO

## 2020-02-04 LAB — CBC WITH DIFFERENTIAL/PLATELET
Basophils Absolute: 0 10*3/uL (ref 0.0–0.1)
Basophils Relative: 0.5 % (ref 0.0–3.0)
Eosinophils Absolute: 0.1 10*3/uL (ref 0.0–0.7)
Eosinophils Relative: 2.1 % (ref 0.0–5.0)
HCT: 39.9 % (ref 39.0–52.0)
Hemoglobin: 13.7 g/dL (ref 13.0–17.0)
Lymphocytes Relative: 21.9 % (ref 12.0–46.0)
Lymphs Abs: 1.3 10*3/uL (ref 0.7–4.0)
MCHC: 34.3 g/dL (ref 30.0–36.0)
MCV: 92.4 fl (ref 78.0–100.0)
Monocytes Absolute: 0.7 10*3/uL (ref 0.1–1.0)
Monocytes Relative: 13 % — ABNORMAL HIGH (ref 3.0–12.0)
Neutro Abs: 3.6 10*3/uL (ref 1.4–7.7)
Neutrophils Relative %: 62.5 % (ref 43.0–77.0)
Platelets: 244 10*3/uL (ref 150.0–400.0)
RBC: 4.32 Mil/uL (ref 4.22–5.81)
RDW: 13.2 % (ref 11.5–15.5)
WBC: 5.8 10*3/uL (ref 4.0–10.5)

## 2020-02-04 LAB — FERRITIN: Ferritin: 10.1 ng/mL — ABNORMAL LOW (ref 22.0–322.0)

## 2020-02-07 ENCOUNTER — Other Ambulatory Visit: Payer: Self-pay

## 2020-06-09 ENCOUNTER — Other Ambulatory Visit (INDEPENDENT_AMBULATORY_CARE_PROVIDER_SITE_OTHER): Payer: BC Managed Care – PPO

## 2020-06-09 LAB — CBC
HCT: 41 % (ref 39.0–52.0)
Hemoglobin: 14 g/dL (ref 13.0–17.0)
MCHC: 34.3 g/dL (ref 30.0–36.0)
MCV: 90.5 fl (ref 78.0–100.0)
Platelets: 247 10*3/uL (ref 150.0–400.0)
RBC: 4.53 Mil/uL (ref 4.22–5.81)
RDW: 14.4 % (ref 11.5–15.5)
WBC: 5.8 10*3/uL (ref 4.0–10.5)

## 2020-06-09 LAB — FERRITIN: Ferritin: 9.1 ng/mL — ABNORMAL LOW (ref 22.0–322.0)

## 2020-06-14 ENCOUNTER — Other Ambulatory Visit: Payer: Self-pay

## 2020-08-23 ENCOUNTER — Other Ambulatory Visit: Payer: Self-pay | Admitting: Neurosurgery

## 2020-08-27 IMAGING — RF DG LUMBAR SPINE 2-3V
1 series · 2 of 2 positions shown · non-contrast
Comparison: 05/18/2019.

CLINICAL DATA: L4-5 fusion.

EXAM:
LUMBAR SPINE - 2-3 VIEW; DG C-ARM 1-60 MIN

[Series 1: run · 2 of 2 slices shown]
[im 1/2]
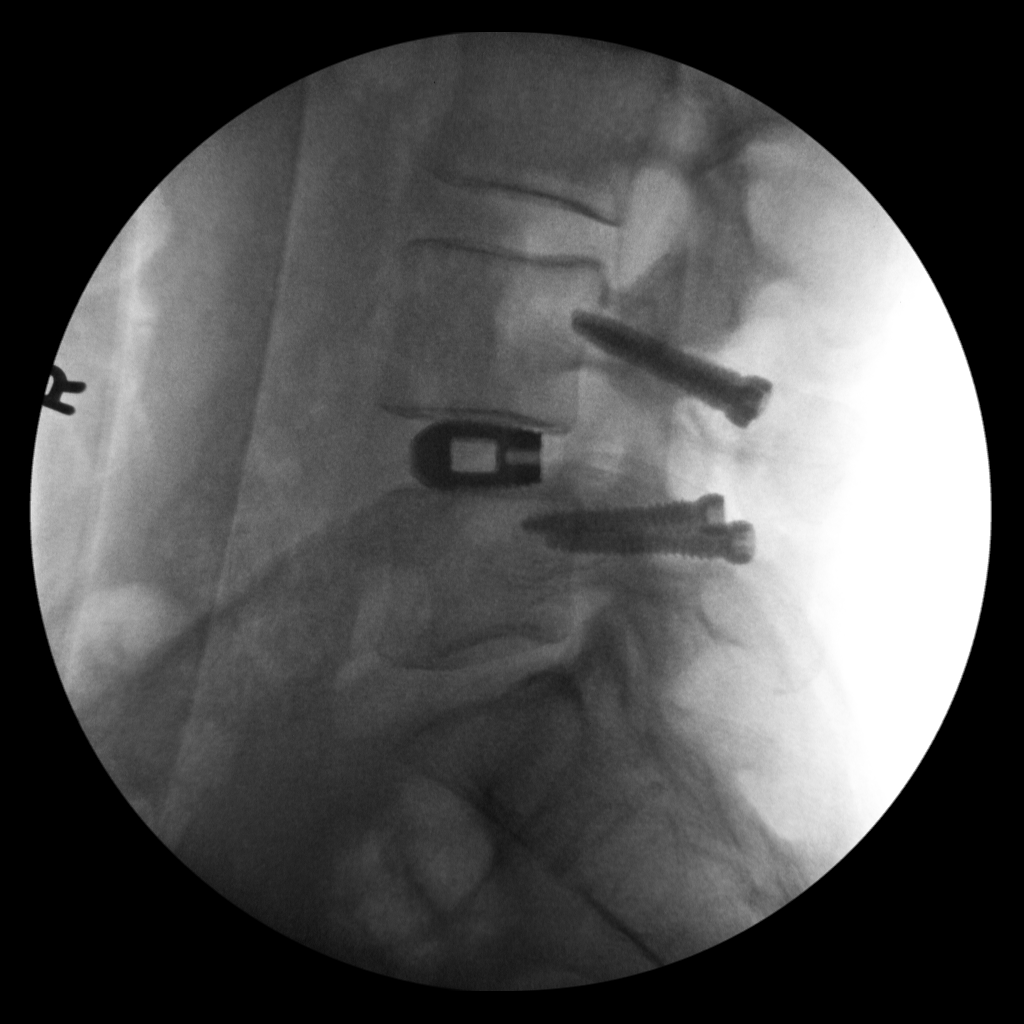
[im 2/2]
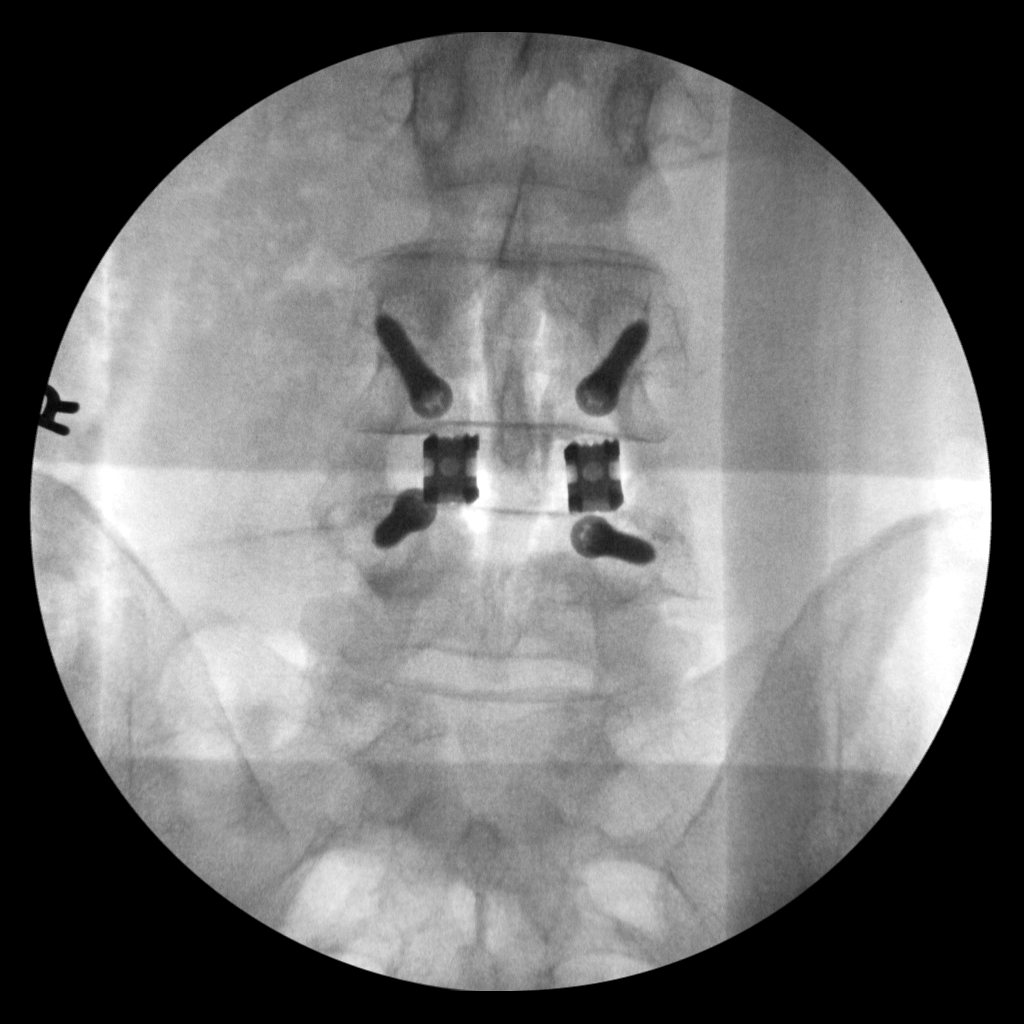

[2 of 2 positions shown; findings below may reference images not displayed]

FINDINGS: Five non-rib-bearing lumbar vertebrae on the previous radiographs.
AP and lateral C-arm views of the lower lumbar spine and upper
sacrum demonstrate interval interbody hardware and pedicle screw
placement at the L4-5 level. Normal alignment on these views.
IMPRESSION: Operative changes, as described above.

## 2020-09-05 ENCOUNTER — Other Ambulatory Visit: Payer: Self-pay

## 2020-09-05 ENCOUNTER — Encounter (HOSPITAL_COMMUNITY): Payer: Self-pay

## 2020-09-05 ENCOUNTER — Encounter (HOSPITAL_COMMUNITY)
Admission: RE | Admit: 2020-09-05 | Discharge: 2020-09-05 | Disposition: A | Discharge status: Home / Self Care | Payer: BC Managed Care – PPO | Source: Ambulatory Visit | Attending: Neurosurgery | Admitting: Neurosurgery

## 2020-09-05 ENCOUNTER — Other Ambulatory Visit (HOSPITAL_COMMUNITY)
Admission: RE | Admit: 2020-09-05 | Discharge: 2020-09-05 | Disposition: A | Discharge status: Home / Self Care | Payer: BC Managed Care – PPO | Source: Ambulatory Visit | Attending: Neurosurgery | Admitting: Neurosurgery

## 2020-09-05 DIAGNOSIS — Z20822 Contact with and (suspected) exposure to covid-19: Secondary | ICD-10-CM | POA: Insufficient documentation

## 2020-09-05 DIAGNOSIS — E119 Type 2 diabetes mellitus without complications: Secondary | ICD-10-CM | POA: Insufficient documentation

## 2020-09-05 DIAGNOSIS — Z01818 Encounter for other preprocedural examination: Secondary | ICD-10-CM | POA: Diagnosis not present

## 2020-09-05 DIAGNOSIS — Z01812 Encounter for preprocedural laboratory examination: Secondary | ICD-10-CM | POA: Diagnosis not present

## 2020-09-05 HISTORY — DX: Headache, unspecified: R51.9

## 2020-09-05 LAB — SARS CORONAVIRUS 2 (TAT 6-24 HRS): SARS Coronavirus 2: NEGATIVE

## 2020-09-05 LAB — BASIC METABOLIC PANEL
Anion gap: 12 (ref 5–15)
BUN: 12 mg/dL (ref 6–20)
CO2: 27 mmol/L (ref 22–32)
Calcium: 9.8 mg/dL (ref 8.9–10.3)
Chloride: 99 mmol/L (ref 98–111)
Creatinine, Ser: 0.86 mg/dL (ref 0.61–1.24)
GFR, Estimated: >60 mL/min (ref >60)
Glucose, Bld: 141 mg/dL — ABNORMAL HIGH (ref 70–99)
Potassium: 4.4 mmol/L (ref 3.5–5.1)
Sodium: 138 mmol/L (ref 135–145)

## 2020-09-05 LAB — CBC
HCT: 40.4 % (ref 39.0–52.0)
Hemoglobin: 13.9 g/dL (ref 13.0–17.0)
MCH: 32.6 pg (ref 26.0–34.0)
MCHC: 34.4 g/dL (ref 30.0–36.0)
MCV: 94.8 fL (ref 80.0–100.0)
Platelets: 214 10*3/uL (ref 150–400)
RBC: 4.26 MIL/uL (ref 4.22–5.81)
RDW: 12.7 % (ref 11.5–15.5)
WBC: 5.4 10*3/uL (ref 4.0–10.5)
nRBC: 0 % (ref 0.0–0.2)

## 2020-09-05 LAB — SURGICAL PCR SCREEN
MRSA, PCR: NEGATIVE
Staphylococcus aureus: NEGATIVE

## 2020-09-05 LAB — HEMOGLOBIN A1C
Hgb A1c MFr Bld: 5.6 % (ref 4.8–5.6)
Mean Plasma Glucose: 114.02 mg/dL

## 2020-09-05 LAB — GLUCOSE, CAPILLARY
Glucose-Capillary: 68 mg/dL — ABNORMAL LOW (ref 70–99)
Glucose-Capillary: 82 mg/dL (ref 70–99)

## 2020-09-05 NOTE — Progress Notes (Signed)
[place], [place] [address] Phone: [phone] Fax: [phone]      Your procedure is scheduled on Friday November 12th.  Report to [facility] Main Entrance "A" at 7:40 A.M., and check in at the Admitting office.  Call this number if you have problems the morning of surgery:  [phone]  Call [phone] if you have any questions prior to your surgery date Monday-Friday 8am-4pm    Remember:  Do not eat or drink anything after midnight the night before your surgery    Take these medicines the morning of surgery with A SIP OF WATER   atorvastatin (LIPITOR)   doxycycline (VIBRAMYCIN)   pregabalin (LYRICA)     IF NEEDED  acetaminophen (TYLENOL)    baclofen (LIORESAL)   fluticasone (FLONASE)          levocetirizine (XYZAL)  sodium chloride (OCEAN) nasal spray  SUMAtriptan (IMITREX)   WHAT DO I DO ABOUT MY DIABETES MEDICATION?  Marland Kitchen Do not take oral diabetes medicines (Metformin) the morning of surgery.   HOW TO MANAGE YOUR DIABETES BEFORE AND AFTER SURGERY  Why is it important to control my blood sugar before and after surgery? . Improving blood sugar levels before and after surgery helps healing and can limit problems. . A way of improving blood sugar control is eating a healthy diet by: o  Eating less sugar and carbohydrates o  Increasing activity/exercise o  Talking with your doctor about reaching your blood sugar goals . High blood sugars (greater than 180 mg/dL) can raise your risk of infections and slow your recovery, so you will need to focus on controlling your diabetes during the weeks before surgery. . Make sure that the doctor who takes care of your diabetes knows about your planned surgery including the date and location.  How do I manage my blood sugar before surgery? . Check your blood sugar at least 4 times a day, starting 2 days before surgery, to make sure that the level is  not too high or low. . Check your blood sugar the morning of your surgery when you wake up and every 2 hours until you get to the Short Stay unit. o If your blood sugar is less than 70 mg/dL, you will need to treat for low blood sugar: - Do not take insulin. - Treat a low blood sugar (less than 70 mg/dL) with  cup of clear juice (cranberry or apple), 4 glucose tablets, OR glucose gel. - Recheck blood sugar in 15 minutes after treatment (to make sure it is greater than 70 mg/dL). If your blood sugar is not greater than 70 mg/dL on recheck, call [phone] for further instructions. . Report your blood sugar to the short stay nurse when you get to Short Stay.  . If you are admitted to the hospital after surgery: o Your blood sugar will be checked by the staff and you will probably be given insulin after surgery (instead of oral diabetes medicines) to make sure you have good blood sugar levels. o The goal for blood sugar control after surgery is 80-180 mg/dL.   As of today, STOP taking any Aspirin (unless otherwise instructed by your surgeon) Aleve, Naproxen, Ibuprofen, Motrin, Advil, Goody's, BC's, all herbal medications, fish oil, and all vitamins.                      Do not wear jewelry  Do not wear lotions, powders, colognes, or deodorant.            Men may shave face and neck.            Do not bring valuables to the hospital.            [facility] is not responsible for any belongings or valuables.  Do NOT Smoke (Tobacco/Vaping) or drink Alcohol 24 hours prior to your procedure If you use a CPAP at night, you may bring all equipment for your overnight stay.   Contacts, glasses, dentures or bridgework may not be worn into surgery.      For patients admitted to the hospital, discharge time will be determined by your treatment team.   Patients discharged the day of surgery will not be allowed to drive home, and someone needs to stay with them for 24 hours.    Special  instructions:   [place]- Preparing For Surgery  Before surgery, you can play an important role. Because skin is not sterile, your skin needs to be as free of germs as possible. You can reduce the number of germs on your skin by washing with CHG (chlorahexidine gluconate) Soap before surgery.  CHG is an antiseptic cleaner which kills germs and bonds with the skin to continue killing germs even after washing.    Oral Hygiene is also important to reduce your risk of infection.  Remember - BRUSH YOUR TEETH THE MORNING OF SURGERY WITH YOUR REGULAR TOOTHPASTE  Please do not use if you have an allergy to CHG or antibacterial soaps. If your skin becomes reddened/irritated stop using the CHG.  Do not shave (including legs and underarms) for at least 48 hours prior to first CHG shower. It is OK to shave your face.  Please follow these instructions carefully.   1. Shower the NIGHT BEFORE SURGERY and the MORNING OF SURGERY with CHG Soap.   2. If you chose to wash your hair, wash your hair first as usual with your normal shampoo.  3. After you shampoo, rinse your hair and body thoroughly to remove the shampoo.  4. Use CHG as you would any other liquid soap. You can apply CHG directly to the skin and wash gently with a scrungie or a clean washcloth.   5. Apply the CHG Soap to your body ONLY FROM THE NECK DOWN.  Do not use on open wounds or open sores. Avoid contact with your eyes, ears, mouth and genitals (private parts). Wash Face and genitals (private parts)  with your normal soap.   6. Wash thoroughly, paying special attention to the area where your surgery will be performed.  7. Thoroughly rinse your body with warm water from the neck down.  8. DO NOT shower/wash with your normal soap after using and rinsing off the CHG Soap.  9. Pat yourself dry with a CLEAN TOWEL.  10. Wear CLEAN PAJAMAS to bed the night before surgery  11. Place CLEAN SHEETS on your bed the night of your first shower and  DO NOT SLEEP WITH PETS.   Day of Surgery: Wear Clean/Comfortable clothing the morning of surgery Do not apply any deodorants/lotions.   Remember to brush your teeth WITH YOUR REGULAR TOOTHPASTE.   Please read over the following fact sheets that you were given.

## 2020-09-05 NOTE — Progress Notes (Signed)
PCP - Dr. Derinda Late  Cardiologist - denies  PPM/ICD - denies   Chest x-ray - N/A EKG - 09/05/2020 Stress Test - denies ECHO - denies Cardiac Cath - denies  Sleep Study - yes CPAP - pt no longer uses since weight loss and septum sx.   Fasting Blood Sugar - 100 Checks Blood Sugar once weekly Hypoglycemic episode with CBG of 68 during PAT appt. Pt given snack and CBG increased to 82 15 minutes later. Pt asymptomatic.   Blood Thinner Instructions: N/A Aspirin Instructions:N/A    COVID TEST- 09/05/2020   Anesthesia review: no  Patient denies shortness of breath, fever, cough and chest pain at PAT appointment   All instructions explained to the patient, with a verbal understanding of the material. Patient agrees to go over the instructions while at home for a better understanding. Patient also instructed to self quarantine after being tested for COVID-19. The opportunity to ask questions was provided.

## 2020-09-05 NOTE — Progress Notes (Signed)
[facility], [place] [address] Phone: [phone] Fax: [phone]      Your procedure is scheduled on Friday November 12th.  Report to [facility] Main Entrance "A" at 7:40 A.M., and check in at the Admitting office.  Call this number if you have problems the morning of surgery:  [phone]  Call [phone] if you have any questions prior to your surgery date Monday-Friday 8am-4pm    Remember:  Do not eat or drink anything after midnight the night before your surgery    Take these medicines the morning of surgery with A SIP OF WATER   atorvastatin (LIPITOR) 20 MG tablet  doxycycline (VIBRAMYCIN) 50 MG capsule  pregabalin (LYRICA) 75 MG capsule    IF NEEDED  acetaminophen (TYLENOL) 500 MG tablet   baclofen (LIORESAL) 10 MG tablet  fluticasone (FLONASE) 50 MCG/ACT nasal spray  sodium chloride (OCEAN) 0.65 % SOLN nasal spray  SUMAtriptan (IMITREX) 100 MG tablet  WHAT DO I DO ABOUT MY DIABETES MEDICATION?   Marland Kitchen Do not take oral diabetes medicines (Metformin) the morning of surgery.   HOW TO MANAGE YOUR DIABETES BEFORE AND AFTER SURGERY  Why is it important to control my blood sugar before and after surgery? . Improving blood sugar levels before and after surgery helps healing and can limit problems. . A way of improving blood sugar control is eating a healthy diet by: o  Eating less sugar and carbohydrates o  Increasing activity/exercise o  Talking with your doctor about reaching your blood sugar goals . High blood sugars (greater than 180 mg/dL) can raise your risk of infections and slow your recovery, so you will need to focus on controlling your diabetes during the weeks before surgery. . Make sure that the doctor who takes care of your diabetes knows about your planned surgery including the date and location.  How do I manage my blood sugar before surgery? . Check your blood sugar at least  4 times a day, starting 2 days before surgery, to make sure that the level is not too high or low. . Check your blood sugar the morning of your surgery when you wake up and every 2 hours until you get to the Short Stay unit. o If your blood sugar is less than 70 mg/dL, you will need to treat for low blood sugar: - Do not take insulin. - Treat a low blood sugar (less than 70 mg/dL) with  cup of clear juice (cranberry or apple), 4 glucose tablets, OR glucose gel. - Recheck blood sugar in 15 minutes after treatment (to make sure it is greater than 70 mg/dL). If your blood sugar is not greater than 70 mg/dL on recheck, call [phone] for further instructions. . Report your blood sugar to the short stay nurse when you get to Short Stay.  . If you are admitted to the hospital after surgery: o Your blood sugar will be checked by the staff and you will probably be given insulin after surgery (instead of oral diabetes medicines) to make sure you have good blood sugar levels. o The goal for blood sugar control after surgery is 80-180 mg/dL.   As of today, STOP taking any Aspirin (unless otherwise instructed by your surgeon) Aleve, Naproxen, Ibuprofen, Motrin, Advil, Goody's, BC's, all herbal medications, fish oil, and all vitamins.  Do not wear jewelry            Do not wear lotions, powders, colognes, or deodorant.            Do not shave 48 hours prior to surgery.  Men may shave face and neck.            Do not bring valuables to the hospital.            [facility] is not responsible for any belongings or valuables.  Do NOT Smoke (Tobacco/Vaping) or drink Alcohol 24 hours prior to your procedure If you use a CPAP at night, you may bring all equipment for your overnight stay.   Contacts, glasses, dentures or bridgework may not be worn into surgery.      For patients admitted to the hospital, discharge time will be determined by your treatment team.   Patients discharged  the day of surgery will not be allowed to drive home, and someone needs to stay with them for 24 hours.    Special instructions:   [facility]- Preparing For Surgery  Before surgery, you can play an important role. Because skin is not sterile, your skin needs to be as free of germs as possible. You can reduce the number of germs on your skin by washing with CHG (chlorahexidine gluconate) Soap before surgery.  CHG is an antiseptic cleaner which kills germs and bonds with the skin to continue killing germs even after washing.    Oral Hygiene is also important to reduce your risk of infection.  Remember - BRUSH YOUR TEETH THE MORNING OF SURGERY WITH YOUR REGULAR TOOTHPASTE  Please do not use if you have an allergy to CHG or antibacterial soaps. If your skin becomes reddened/irritated stop using the CHG.  Do not shave (including legs and underarms) for at least 48 hours prior to first CHG shower. It is OK to shave your face.  Please follow these instructions carefully.   1. Shower the NIGHT BEFORE SURGERY and the MORNING OF SURGERY with CHG Soap.   2. If you chose to wash your hair, wash your hair first as usual with your normal shampoo.  3. After you shampoo, rinse your hair and body thoroughly to remove the shampoo.  4. Use CHG as you would any other liquid soap. You can apply CHG directly to the skin and wash gently with a scrungie or a clean washcloth.   5. Apply the CHG Soap to your body ONLY FROM THE NECK DOWN.  Do not use on open wounds or open sores. Avoid contact with your eyes, ears, mouth and genitals (private parts). Wash Face and genitals (private parts)  with your normal soap.   6. Wash thoroughly, paying special attention to the area where your surgery will be performed.  7. Thoroughly rinse your body with warm water from the neck down.  8. DO NOT shower/wash with your normal soap after using and rinsing off the CHG Soap.  9. Pat yourself dry with a CLEAN TOWEL.  10. Wear  CLEAN PAJAMAS to bed the night before surgery  11. Place CLEAN SHEETS on your bed the night of your first shower and DO NOT SLEEP WITH PETS.   Day of Surgery: Wear Clean/Comfortable clothing the morning of surgery Do not apply any deodorants/lotions.   Remember to brush your teeth WITH YOUR REGULAR TOOTHPASTE.   Please read over the following fact sheets that you were given.

## 2020-09-08 ENCOUNTER — Encounter (HOSPITAL_COMMUNITY)
Admission: RE | Disposition: A | Discharge status: Home / Self Care | Payer: Self-pay | Source: Home / Self Care | Attending: Neurosurgery

## 2020-09-08 ENCOUNTER — Other Ambulatory Visit: Payer: Self-pay

## 2020-09-08 ENCOUNTER — Ambulatory Visit (HOSPITAL_COMMUNITY): Payer: BC Managed Care – PPO | Admitting: Vascular Surgery

## 2020-09-08 ENCOUNTER — Ambulatory Visit (HOSPITAL_COMMUNITY): Payer: BC Managed Care – PPO

## 2020-09-08 ENCOUNTER — Encounter (HOSPITAL_COMMUNITY): Payer: Self-pay | Admitting: Neurosurgery

## 2020-09-08 ENCOUNTER — Observation Stay (HOSPITAL_COMMUNITY)
Admission: RE | Admit: 2020-09-08 | Discharge: 2020-09-08 | Disposition: A | Discharge status: Home / Self Care | Payer: BC Managed Care – PPO | Attending: Neurosurgery | Admitting: Neurosurgery

## 2020-09-08 ENCOUNTER — Ambulatory Visit (HOSPITAL_COMMUNITY): Payer: BC Managed Care – PPO | Admitting: Anesthesiology

## 2020-09-08 DIAGNOSIS — Z7984 Long term (current) use of oral hypoglycemic drugs: Secondary | ICD-10-CM | POA: Diagnosis not present

## 2020-09-08 DIAGNOSIS — E119 Type 2 diabetes mellitus without complications: Secondary | ICD-10-CM | POA: Insufficient documentation

## 2020-09-08 DIAGNOSIS — Z79899 Other long term (current) drug therapy: Secondary | ICD-10-CM | POA: Diagnosis not present

## 2020-09-08 DIAGNOSIS — M5126 Other intervertebral disc displacement, lumbar region: Secondary | ICD-10-CM | POA: Diagnosis present

## 2020-09-08 DIAGNOSIS — M5418 Radiculopathy, sacral and sacrococcygeal region: Secondary | ICD-10-CM | POA: Insufficient documentation

## 2020-09-08 DIAGNOSIS — M5127 Other intervertebral disc displacement, lumbosacral region: Secondary | ICD-10-CM | POA: Diagnosis not present

## 2020-09-08 DIAGNOSIS — M25552 Pain in left hip: Secondary | ICD-10-CM | POA: Diagnosis present

## 2020-09-08 DIAGNOSIS — Z419 Encounter for procedure for purposes other than remedying health state, unspecified: Secondary | ICD-10-CM

## 2020-09-08 HISTORY — PX: LUMBAR LAMINECTOMY/DECOMPRESSION MICRODISCECTOMY: SHX5026

## 2020-09-08 LAB — GLUCOSE, CAPILLARY
Glucose-Capillary: 97 mg/dL (ref 70–99)
Glucose-Capillary: 94 mg/dL (ref 70–99)
Glucose-Capillary: 87 mg/dL (ref 70–99)

## 2020-09-08 SURGERY — LUMBAR LAMINECTOMY/DECOMPRESSION MICRODISCECTOMY 1 LEVEL
Anesthesia: General | Site: Back | Laterality: Left

## 2020-09-08 MED ORDER — VITAMIN D 25 MCG (1000 UNIT) PO TABS: 1000.0000 [IU] | ORAL_TABLET | Freq: Every day | ORAL | Status: DC

## 2020-09-08 MED ORDER — MIDAZOLAM HCL 5 MG/5ML IJ SOLN
INTRAMUSCULAR | Status: DC | PRN
  Administered 2020-09-08: 2 mg via INTRAVENOUS

## 2020-09-08 MED ORDER — OXYCODONE HCL 5 MG/5ML PO SOLN: 5.0000 mg | Freq: Once | ORAL | Status: DC | PRN

## 2020-09-08 MED ORDER — PREGABALIN 75 MG PO CAPS: 150.0000 mg | ORAL_CAPSULE | Freq: Every day | ORAL | Status: DC

## 2020-09-08 MED ORDER — ATORVASTATIN CALCIUM 10 MG PO TABS: 20.0000 mg | ORAL_TABLET | Freq: Every day | ORAL | Status: DC

## 2020-09-08 MED ORDER — FENTANYL CITRATE (PF) 100 MCG/2ML IJ SOLN
INTRAMUSCULAR | Status: DC | PRN
  Administered 2020-09-08: 50 ug via INTRAVENOUS
  Administered 2020-09-08: 100 ug via INTRAVENOUS

## 2020-09-08 MED ORDER — MENTHOL 3 MG MT LOZG: 1.0000 | LOZENGE | OROMUCOSAL | Status: DC | PRN

## 2020-09-08 MED ORDER — SODIUM CHLORIDE 0.9% FLUSH: 3.0000 mL | Freq: Two times a day (BID) | INTRAVENOUS | Status: DC

## 2020-09-08 MED ORDER — CHLORHEXIDINE GLUCONATE CLOTH 2 % EX PADS: 6.0000 | MEDICATED_PAD | Freq: Once | CUTANEOUS | Status: DC

## 2020-09-08 MED ORDER — OXYCODONE HCL 5 MG PO TABS
10.0000 mg | ORAL_TABLET | ORAL | Status: DC | PRN
  Administered 2020-09-08: 10 mg via ORAL
  Filled 2020-09-08: qty 2

## 2020-09-08 MED ORDER — THROMBIN 5000 UNITS EX SOLR
CUTANEOUS | Status: DC | PRN
  Administered 2020-09-08 (×2): 5000 [IU] via TOPICAL

## 2020-09-08 MED ORDER — 0.9 % SODIUM CHLORIDE (POUR BTL) OPTIME
TOPICAL | Status: DC | PRN
  Administered 2020-09-08: 1000 mL

## 2020-09-08 MED ORDER — THROMBIN 5000 UNITS EX SOLR
CUTANEOUS | Status: AC
  Filled 2020-09-08: qty 15000

## 2020-09-08 MED ORDER — OXYCODONE-ACETAMINOPHEN 5-325 MG PO TABS
1.0000 | ORAL_TABLET | ORAL | 0 refills | Status: DC | PRN
Start: 2020-09-08 — End: 2021-08-25

## 2020-09-08 MED ORDER — HEMOSTATIC AGENTS (NO CHARGE) OPTIME
TOPICAL | Status: DC | PRN
  Administered 2020-09-08: 1 via TOPICAL

## 2020-09-08 MED ORDER — SENNA 8.6 MG PO TABS: 1.0000 | ORAL_TABLET | Freq: Two times a day (BID) | ORAL | Status: DC

## 2020-09-08 MED ORDER — HYDROCODONE-ACETAMINOPHEN 5-325 MG PO TABS: 1.0000 | ORAL_TABLET | ORAL | Status: DC | PRN

## 2020-09-08 MED ORDER — SALINE SPRAY 0.65 % NA SOLN
1.0000 | Freq: Every day | NASAL | Status: DC | PRN
  Filled 2020-09-08: qty 44

## 2020-09-08 MED ORDER — OXYCODONE HCL 5 MG PO TABS: 5.0000 mg | ORAL_TABLET | Freq: Once | ORAL | Status: DC | PRN

## 2020-09-08 MED ORDER — ACETAMINOPHEN 325 MG PO TABS: 650.0000 mg | ORAL_TABLET | ORAL | Status: DC | PRN

## 2020-09-08 MED ORDER — PREGABALIN 75 MG PO CAPS: 75.0000 mg | ORAL_CAPSULE | Freq: Every morning | ORAL | Status: DC

## 2020-09-08 MED ORDER — SUGAMMADEX SODIUM 200 MG/2ML IV SOLN
INTRAVENOUS | Status: DC | PRN
  Administered 2020-09-08: 200 mg via INTRAVENOUS

## 2020-09-08 MED ORDER — LIDOCAINE-EPINEPHRINE 1 %-1:100000 IJ SOLN
INTRAMUSCULAR | Status: AC
  Filled 2020-09-08: qty 1

## 2020-09-08 MED ORDER — PROPOFOL 10 MG/ML IV BOLUS
INTRAVENOUS | Status: DC | PRN
  Administered 2020-09-08: 200 mg via INTRAVENOUS

## 2020-09-08 MED ORDER — ONDANSETRON HCL 4 MG PO TABS: 4.0000 mg | ORAL_TABLET | Freq: Four times a day (QID) | ORAL | Status: DC | PRN

## 2020-09-08 MED ORDER — SUMATRIPTAN SUCCINATE 50 MG PO TABS
50.0000 mg | ORAL_TABLET | ORAL | Status: DC | PRN
  Filled 2020-09-08: qty 2

## 2020-09-08 MED ORDER — HYDROMORPHONE HCL 1 MG/ML IJ SOLN: 1.0000 mg | INTRAMUSCULAR | Status: DC | PRN

## 2020-09-08 MED ORDER — BACLOFEN 10 MG PO TABS: 10.0000 mg | ORAL_TABLET | Freq: Two times a day (BID) | ORAL | 1 refills | Status: AC

## 2020-09-08 MED ORDER — BUPIVACAINE HCL (PF) 0.25 % IJ SOLN
INTRAMUSCULAR | Status: AC
  Filled 2020-09-08: qty 30

## 2020-09-08 MED ORDER — LORATADINE 10 MG PO TABS: 10.0000 mg | ORAL_TABLET | Freq: Every day | ORAL | Status: DC | PRN

## 2020-09-08 MED ORDER — LIDOCAINE 2% (20 MG/ML) 5 ML SYRINGE
INTRAMUSCULAR | Status: AC
  Filled 2020-09-08: qty 5

## 2020-09-08 MED ORDER — FENTANYL CITRATE (PF) 250 MCG/5ML IJ SOLN
INTRAMUSCULAR | Status: AC
  Filled 2020-09-08: qty 5

## 2020-09-08 MED ORDER — SODIUM CHLORIDE 0.9 % IV SOLN
250.0000 mL | INTRAVENOUS | Status: DC
  Administered 2020-09-08: 250 mL via INTRAVENOUS

## 2020-09-08 MED ORDER — ONDANSETRON HCL 4 MG/2ML IJ SOLN
INTRAMUSCULAR | Status: AC
  Filled 2020-09-08: qty 2

## 2020-09-08 MED ORDER — ROCURONIUM BROMIDE 10 MG/ML (PF) SYRINGE
PREFILLED_SYRINGE | INTRAVENOUS | Status: DC | PRN
  Administered 2020-09-08: 50 mg via INTRAVENOUS

## 2020-09-08 MED ORDER — LACTATED RINGERS IV SOLN: INTRAVENOUS | Status: DC | PRN

## 2020-09-08 MED ORDER — ACETAMINOPHEN 650 MG RE SUPP: 650.0000 mg | RECTAL | Status: DC | PRN

## 2020-09-08 MED ORDER — LACTATED RINGERS IV SOLN: INTRAVENOUS | Status: DC

## 2020-09-08 MED ORDER — BACLOFEN 10 MG PO TABS: 10.0000 mg | ORAL_TABLET | Freq: Every day | ORAL | Status: DC | PRN

## 2020-09-08 MED ORDER — CEFAZOLIN SODIUM-DEXTROSE 2-4 GM/100ML-% IV SOLN: 2.0000 g | Freq: Three times a day (TID) | INTRAVENOUS | Status: DC

## 2020-09-08 MED ORDER — LIDOCAINE-EPINEPHRINE 1 %-1:100000 IJ SOLN
INTRAMUSCULAR | Status: DC | PRN
  Administered 2020-09-08: 8 mL

## 2020-09-08 MED ORDER — DEXAMETHASONE SODIUM PHOSPHATE 10 MG/ML IJ SOLN
10.0000 mg | Freq: Once | INTRAMUSCULAR | Status: AC
  Administered 2020-09-08: 10 mg via INTRAVENOUS
  Filled 2020-09-08: qty 1

## 2020-09-08 MED ORDER — LIDOCAINE HCL (CARDIAC) PF 100 MG/5ML IV SOSY
PREFILLED_SYRINGE | INTRAVENOUS | Status: DC | PRN
  Administered 2020-09-08: 40 mg via INTRAVENOUS

## 2020-09-08 MED ORDER — PHENOL 1.4 % MT LIQD: 1.0000 | OROMUCOSAL | Status: DC | PRN

## 2020-09-08 MED ORDER — ONDANSETRON HCL 4 MG/2ML IJ SOLN
INTRAMUSCULAR | Status: DC | PRN
  Administered 2020-09-08: 4 mg via INTRAVENOUS

## 2020-09-08 MED ORDER — DIPHENHYDRAMINE-APAP (SLEEP) 25-500 MG PO TABS: 2.0000 | ORAL_TABLET | Freq: Every evening | ORAL | Status: DC | PRN

## 2020-09-08 MED ORDER — PROMETHAZINE HCL 25 MG/ML IJ SOLN: 6.2500 mg | INTRAMUSCULAR | Status: DC | PRN

## 2020-09-08 MED ORDER — ROCURONIUM BROMIDE 10 MG/ML (PF) SYRINGE
PREFILLED_SYRINGE | INTRAVENOUS | Status: AC
  Filled 2020-09-08: qty 10

## 2020-09-08 MED ORDER — MIDAZOLAM HCL 2 MG/2ML IJ SOLN
INTRAMUSCULAR | Status: AC
  Filled 2020-09-08: qty 2

## 2020-09-08 MED ORDER — DOCUSATE SODIUM 100 MG PO CAPS: 100.0000 mg | ORAL_CAPSULE | Freq: Two times a day (BID) | ORAL | Status: DC

## 2020-09-08 MED ORDER — CHLORHEXIDINE GLUCONATE 0.12 % MT SOLN
15.0000 mL | Freq: Once | OROMUCOSAL | Status: AC
  Administered 2020-09-08: 15 mL via OROMUCOSAL
  Filled 2020-09-08: qty 15

## 2020-09-08 MED ORDER — FENTANYL CITRATE (PF) 100 MCG/2ML IJ SOLN: 25.0000 ug | INTRAMUSCULAR | Status: DC | PRN

## 2020-09-08 MED ORDER — FLUTICASONE PROPIONATE 50 MCG/ACT NA SUSP
2.0000 | Freq: Every day | NASAL | Status: DC | PRN
  Filled 2020-09-08: qty 16

## 2020-09-08 MED ORDER — SODIUM CHLORIDE 0.9% FLUSH: 3.0000 mL | INTRAVENOUS | Status: DC | PRN

## 2020-09-08 MED ORDER — METFORMIN HCL ER 500 MG PO TB24: 1000.0000 mg | ORAL_TABLET | Freq: Two times a day (BID) | ORAL | Status: DC

## 2020-09-08 MED ORDER — NORTRIPTYLINE HCL 25 MG PO CAPS
25.0000 mg | ORAL_CAPSULE | Freq: Every evening | ORAL | Status: DC | PRN
  Filled 2020-09-08: qty 2

## 2020-09-08 MED ORDER — CEFAZOLIN SODIUM-DEXTROSE 2-4 GM/100ML-% IV SOLN
2.0000 g | INTRAVENOUS | Status: AC
  Administered 2020-09-08: 2 g via INTRAVENOUS
  Filled 2020-09-08: qty 100

## 2020-09-08 MED ORDER — ONDANSETRON HCL 4 MG/2ML IJ SOLN: 4.0000 mg | Freq: Four times a day (QID) | INTRAMUSCULAR | Status: DC | PRN

## 2020-09-08 MED ORDER — PROPOFOL 10 MG/ML IV BOLUS
INTRAVENOUS | Status: AC
  Filled 2020-09-08: qty 20

## 2020-09-08 MED ORDER — ORAL CARE MOUTH RINSE: 15.0000 mL | Freq: Once | OROMUCOSAL | Status: AC

## 2020-09-08 SURGICAL SUPPLY — 61 items
ADH SKN CLS APL DERMABOND .7 (GAUZE/BANDAGES/DRESSINGS) ×1
APL SKNCLS STERI-STRIP NONHPOA (GAUZE/BANDAGES/DRESSINGS) ×1
BAND INSRT 18 STRL LF DISP RB (MISCELLANEOUS) ×2
BAND RUBBER #18 3X1/16 STRL (MISCELLANEOUS) ×6 IMPLANT
BENZOIN TINCTURE PRP APPL 2/3 (GAUZE/BANDAGES/DRESSINGS) ×3 IMPLANT
BLADE CLIPPER SURG (BLADE) ×3 IMPLANT
BLADE SURG 11 STRL SS (BLADE) ×3 IMPLANT
BUR CUTTER 7.0 ROUND (BURR) ×3 IMPLANT
BUR MATCHSTICK NEURO 3.0 LAGG (BURR) ×3 IMPLANT
CANISTER SUCT 3000ML PPV (MISCELLANEOUS) ×3 IMPLANT
CARTRIDGE OIL MAESTRO DRILL (MISCELLANEOUS) ×1 IMPLANT
CLOSURE WOUND 1/2 X4 (GAUZE/BANDAGES/DRESSINGS) ×1
COVER WAND RF STERILE (DRAPES) ×3 IMPLANT
DECANTER SPIKE VIAL GLASS SM (MISCELLANEOUS) ×3 IMPLANT
DERMABOND ADVANCED (GAUZE/BANDAGES/DRESSINGS) ×2
DERMABOND ADVANCED .7 DNX12 (GAUZE/BANDAGES/DRESSINGS) ×1 IMPLANT
DIFFUSER DRILL AIR PNEUMATIC (MISCELLANEOUS) ×3 IMPLANT
DRAPE HALF SHEET 40X57 (DRAPES) IMPLANT
DRAPE LAPAROTOMY 100X72X124 (DRAPES) ×3 IMPLANT
DRAPE MICROSCOPE LEICA (MISCELLANEOUS) ×3 IMPLANT
DRAPE SURG 17X23 STRL (DRAPES) ×3 IMPLANT
DRSG OPSITE POSTOP 4X6 (GAUZE/BANDAGES/DRESSINGS) ×2 IMPLANT
DURAPREP 26ML APPLICATOR (WOUND CARE) ×3 IMPLANT
ELECT REM PT RETURN 9FT ADLT (ELECTROSURGICAL) ×3
ELECTRODE REM PT RTRN 9FT ADLT (ELECTROSURGICAL) ×1 IMPLANT
GAUZE 4X4 16PLY RFD (DISPOSABLE) IMPLANT
GAUZE SPONGE 4X4 12PLY STRL (GAUZE/BANDAGES/DRESSINGS) ×3 IMPLANT
GLOVE BIO SURGEON STRL SZ7 (GLOVE) ×2 IMPLANT
GLOVE BIO SURGEON STRL SZ8 (GLOVE) ×3 IMPLANT
GLOVE BIOGEL PI IND STRL 7.0 (GLOVE) IMPLANT
GLOVE BIOGEL PI IND STRL 7.5 (GLOVE) IMPLANT
GLOVE BIOGEL PI IND STRL 8 (GLOVE) IMPLANT
GLOVE BIOGEL PI INDICATOR 7.0 (GLOVE)
GLOVE BIOGEL PI INDICATOR 7.5 (GLOVE) ×2
GLOVE BIOGEL PI INDICATOR 8 (GLOVE) ×2
GLOVE ECLIPSE 6.5 STRL STRAW (GLOVE) ×2 IMPLANT
GLOVE ECLIPSE 7.0 STRL STRAW (GLOVE) ×2 IMPLANT
GLOVE EXAM NITRILE XL STR (GLOVE) IMPLANT
GLOVE INDICATOR 8.5 STRL (GLOVE) ×3 IMPLANT
GOWN STRL REUS W/ TWL LRG LVL3 (GOWN DISPOSABLE) ×1 IMPLANT
GOWN STRL REUS W/ TWL XL LVL3 (GOWN DISPOSABLE) ×2 IMPLANT
GOWN STRL REUS W/TWL 2XL LVL3 (GOWN DISPOSABLE) IMPLANT
GOWN STRL REUS W/TWL LRG LVL3 (GOWN DISPOSABLE) ×12
GOWN STRL REUS W/TWL XL LVL3 (GOWN DISPOSABLE) ×3
KIT BASIN OR (CUSTOM PROCEDURE TRAY) ×3 IMPLANT
KIT TURNOVER KIT B (KITS) ×3 IMPLANT
NDL SPNL 22GX3.5 QUINCKE BK (NEEDLE) ×1 IMPLANT
NEEDLE HYPO 22GX1.5 SAFETY (NEEDLE) ×3 IMPLANT
NEEDLE SPNL 22GX3.5 QUINCKE BK (NEEDLE) ×3 IMPLANT
NS IRRIG 1000ML POUR BTL (IV SOLUTION) ×3 IMPLANT
OIL CARTRIDGE MAESTRO DRILL (MISCELLANEOUS) ×3
PACK LAMINECTOMY NEURO (CUSTOM PROCEDURE TRAY) ×3 IMPLANT
SPONGE SURGIFOAM ABS GEL SZ50 (HEMOSTASIS) ×3 IMPLANT
STRIP CLOSURE SKIN 1/2X4 (GAUZE/BANDAGES/DRESSINGS) ×2 IMPLANT
SUT VIC AB 0 CT1 18XCR BRD8 (SUTURE) ×1 IMPLANT
SUT VIC AB 0 CT1 8-18 (SUTURE) ×3
SUT VIC AB 2-0 CT1 18 (SUTURE) ×3 IMPLANT
SUT VICRYL 4-0 PS2 18IN ABS (SUTURE) ×3 IMPLANT
TOWEL GREEN STERILE (TOWEL DISPOSABLE) ×3 IMPLANT
TOWEL GREEN STERILE FF (TOWEL DISPOSABLE) ×3 IMPLANT
WATER STERILE IRR 1000ML POUR (IV SOLUTION) ×3 IMPLANT

## 2020-09-08 NOTE — Discharge Summary (Signed)
Physician Discharge Summary  Patient ID: [name] MRN: [id number] DOB/AGE: [birth year] 54 y.o. Estimated body mass index is 23.34 kg/m as calculated from the following:   Height as of this encounter: 5\' 7"  (1.702 m).   Weight as of this encounter: 67.6 kg.   Admit date: 09/08/2020 Discharge date: 09/08/2020  Admission Diagnoses: Left S1 radiculopathy herniated nucleus pulposus L5-S1 left  Discharge Diagnoses: Same Active Problems:   Lumbar disc herniation   Discharged Condition: good  Hospital Course: Patient admitted to hospital underwent left-sided L5-S1 microdiscectomy postoperative patient very well with covering the floor on the floor was ambulating voiding spontaneously tolerating regular diet stable for discharge home.  Consults: Significant Diagnostic Studies: Treatments: L5-S1 microdiscectomy Discharge Exam: Blood pressure (!) 147/83, pulse 61, temperature (!) 97 F (36.1 C), resp. rate 18, height 5\' 7"  (1.702 m), weight 67.6 kg, SpO2 99 %. Strength out of 5 wound clean dry and intact  Disposition: Home  Discharge Instructions    Incentive spirometry RT   Complete by: As directed      Allergies as of 09/08/2020   No Known Allergies     Medication List    TAKE these medications   acetaminophen 500 MG tablet Commonly known as: TYLENOL Take 1,000 mg by mouth every 6 (six) hours as needed for moderate pain.   atorvastatin 20 MG tablet Commonly known as: LIPITOR Take 20 mg by mouth daily.   baclofen 10 MG tablet Commonly known as: LIORESAL Take 10-20 mg by mouth daily as needed for muscle spasms. What changed: Another medication with the same name was added. Make sure you understand how and when to take each.   baclofen 10 MG tablet Commonly known as: LIORESAL Take 1 tablet (10 mg total) by mouth 2 (two) times daily. What changed: You were already taking a medication with the same name, and this prescription was added. Make sure you  understand how and when to take each.   clindamycin 1 % external solution Commonly known as: CLEOCIN T Apply 1 application topically daily as needed. Scalp condiction   diphenhydramine-acetaminophen 25-500 MG Tabs tablet Commonly known as: TYLENOL PM Take 2 tablets by mouth at bedtime as needed (sleep).   doxycycline 50 MG capsule Commonly known as: VIBRAMYCIN Take 50 mg by mouth daily.   Fish Oil 1200 MG Caps Take 1,000 mg by mouth 3 (three) times daily.   fluticasone 50 MCG/ACT nasal spray Commonly known as: FLONASE Place 2 sprays into both nostrils daily as needed for allergies.   levocetirizine 5 MG tablet Commonly known as: XYZAL Take 5 mg by mouth daily as needed for allergies.   metFORMIN 500 MG 24 hr tablet Commonly known as: GLUCOPHAGE-XR Take 1,000 mg by mouth 2 (two) times daily with breakfast and lunch.   nortriptyline 25 MG capsule Commonly known as: PAMELOR Take 25-50 mg by mouth at bedtime as needed for sleep.   oxyCODONE-acetaminophen 5-325 MG tablet Commonly known as: Percocet Take 1 tablet by mouth every 4 (four) hours as needed for severe pain.   pregabalin 75 MG capsule Commonly known as: LYRICA Take 75-150 mg by mouth See admin instructions. Take 75 mg by mouth at lunch and 150 mg at bedtime   sodium chloride 0.65 % Soln nasal spray Commonly known as: OCEAN Place 1 spray into both nostrils daily as needed for congestion.   SUMAtriptan 100 MG tablet Commonly known as: IMITREX Take 50-100 mg by mouth as needed for migraine.   Vitamin D3 25  MCG (1000 UT) Caps Take 1,000 Units by mouth daily.       Follow-up Information    Kary Kos, MD Follow up.   Specialty: Neurosurgery Contact information: [address] [place] [address] [phone]               Signed: Elaina Hoops 09/08/2020, 4:08 PM

## 2020-09-08 NOTE — Evaluation (Signed)
Physical Therapy Evaluation and discharge Patient Details Name: [name] MRN: [id number] DOB: [birth year] Today's Date: 09/08/2020   History of Present Illness  Pt is a 54 y/o male s/p L5-S1 microdiscectomy. PMH includes fibromyalgia, DM, and back surgery  Clinical Impression  Patient evaluated by Physical Therapy with no further acute PT needs identified. All education has been completed and the patient has no further questions. Pt overall at a mod I to supervision level. No overt LOB noted during mobility. Pt able to recall back precautions and how to maintain during mobility/ADLs, as he has had back surgery before. Reports his mom and sister will be able to assist at d/c. See below for any follow-up Physical Therapy or equipment needs. PT is signing off. Thank you for this referral. If needs change, please re-consult.     Follow Up Recommendations No PT follow up (may benefit from outpatient PT once cleared by MD)    Equipment Recommendations  None recommended by PT    Recommendations for Other Services       Precautions / Restrictions Precautions Precautions: Back Precaution Booklet Issued: Yes (comment) Precaution Comments: Pt able to recall back precautions from previous surgery. Able to recall how to maintain during ADLs as well.  Restrictions Weight Bearing Restrictions: No      Mobility  Bed Mobility Overal bed mobility: Modified Independent             General bed mobility comments: Able to maintain precautions using log roll technique. Did not require cues    Transfers Overall transfer level: Modified independent               General transfer comment: Slower to stand, but know physical assist required.   Ambulation/Gait Ambulation/Gait assistance: Modified independent (Device/Increase time) Gait Distance (Feet): 300 Feet Assistive device: None Gait Pattern/deviations: Step-through pattern;Decreased stride length Gait velocity: Decreased    General Gait Details: Slower gait speed, however, no LOB noted. Educated about generalized walking program.   Stairs Stairs: Yes Stairs assistance: Supervision Stair Management: Alternating pattern;Forwards (with hand on wall as he has at home) Number of Stairs: 3 General stair comments: Overall steady stair navigation. No LOB noted.   Wheelchair Mobility    Modified Rankin (Stroke Patients Only)       Balance Overall balance assessment: No apparent balance deficits (not formally assessed)                                           Pertinent Vitals/Pain Pain Assessment: Faces Faces Pain Scale: Hurts a little bit Pain Location: back Pain Descriptors / Indicators: Aching;Operative site guarding Pain Intervention(s): Monitored during session;Limited activity within patient's tolerance;Repositioned    Home Living Family/patient expects to be discharged to:: Private residence Living Arrangements: Parent Available Help at Discharge: Family;Available 24 hours/day Type of Home: House Home Access: Stairs to enter Entrance Stairs-Rails: None Entrance Stairs-Number of Steps: 2 Home Layout: One level Home Equipment: None      Prior Function Level of Independence: Independent               Hand Dominance        Extremity/Trunk Assessment   Upper Extremity Assessment Upper Extremity Assessment: Overall WFL for tasks assessed    Lower Extremity Assessment Lower Extremity Assessment: Overall WFL for tasks assessed;LLE deficits/detail LLE Deficits / Details: Reports LLE pain has improved.  Cervical / Trunk Assessment Cervical / Trunk Assessment: Other exceptions Cervical / Trunk Exceptions: s/p Lumbar surgery   Communication   Communication: No difficulties  Cognition Arousal/Alertness: Awake/alert Behavior During Therapy: WFL for tasks assessed/performed Overall Cognitive Status: Within Functional Limits for tasks assessed                                         General Comments General comments (skin integrity, edema, etc.): Pt's mother present during session     Exercises     Assessment/Plan    PT Assessment Patent does not need any further PT services  PT Problem List         PT Treatment Interventions      PT Goals (Current goals can be found in the Care Plan section)  Acute Rehab PT Goals Patient Stated Goal: to go home PT Goal Formulation: With patient Time For Goal Achievement: 09/08/20 Potential to Achieve Goals: Good    Frequency     Barriers to discharge        Co-evaluation               AM-PAC PT "6 Clicks" Mobility  Outcome Measure Help needed turning from your back to your side while in a flat bed without using bedrails?: None Help needed moving from lying on your back to sitting on the side of a flat bed without using bedrails?: None Help needed moving to and from a bed to a chair (including a wheelchair)?: None Help needed standing up from a chair using your arms (e.g., wheelchair or bedside chair)?: None Help needed to walk in hospital room?: None Help needed climbing 3-5 steps with a railing? : None 6 Click Score: 24    End of Session   Activity Tolerance: Patient tolerated treatment well Patient left: in bed;with call bell/phone within reach;with family/visitor present Nurse Communication: Mobility status PT Visit Diagnosis: Other abnormalities of gait and mobility (R26.89)    Time: 8841-6606 PT Time Calculation (min) (ACUTE ONLY): 11 min   Charges:   PT Evaluation $PT Eval Low Complexity: 1 Low          Lou Miner, DPT  Acute Rehabilitation Services  Pager: [phone] Office: [phone]   Rudean Hitt 09/08/2020, 3:39 PM

## 2020-09-08 NOTE — H&P (Signed)
[name] is an 54 y.o. male.   Chief Complaint: Left hip and leg pain HPI: 54 year old gentleman status post L4-5 fusion did very well from that presents with left hip and leg pain rating down an S1 nerve root pattern.  Work-up revealed a disc herniation with a free fragment migrating caudally behind the S1 nerve root due to the patient's progression of clinical syndrome imaging findings failed conservative treatment I have recommended lumbar microdiscectomy.  Patient has no midline back pain he had no motion in flexion-extension I did explain to him the risks of instability with doing a microdiscectomy below the level of fusion however in the setting with isolated radiculopathy and free fragment I feel that we have reasonable enough opportunity for success with just a discectomy to save him an additional level of fusion.  And I explained Lissa Merlin to him as well as perioperative course expectations of outcome and alternatives of surgery and he understood and agreed to proceed forward.  Past Medical History:  Diagnosis Date  . Allergy   . Anemia   . Blood dyscrasia    hemachromatosis  . DDD (degenerative disc disease), cervical   . DDD (degenerative disc disease), lumbar   . Depression   . Diabetes mellitus without complication ([place])    type 2  . Fibromyalgia   . Folliculitis   . Headache    migraines  . Hemochromatosis    followed by Dr Nita Sickle  . Hyperlipidemia   . Neuromuscular disorder ([place])    occasional neuropathy in left foot  . Sleep apnea    past hx- has lost 45lbs- no longer needs CPAP    Past Surgical History:  Procedure Laterality Date  . BACK SURGERY  2020   w/ Dr. Saintclair Halsted  . broken nose surgery    . COLONOSCOPY    . NASAL SEPTUM SURGERY    . UPPER GI ENDOSCOPY    . WISDOM TOOTH EXTRACTION      Family History  Problem Relation Age of Onset  . Hypertension Mother   . Diabetes Mother   . Hyperlipidemia Mother   . Hyperlipidemia Father   . Hypertension Father    . Colon cancer Neg Hx   . Colon polyps Neg Hx   . Esophageal cancer Neg Hx   . Rectal cancer Neg Hx   . Stomach cancer Neg Hx    Social History:  reports that he has never smoked. His smokeless tobacco use includes snuff. He reports current alcohol use. He reports that he does not use drugs.  Allergies: No Known Allergies  Medications Prior to Admission  Medication Sig Dispense Refill  . acetaminophen (TYLENOL) 500 MG tablet Take 1,000 mg by mouth every 6 (six) hours as needed for moderate pain.     Marland Kitchen atorvastatin (LIPITOR) 20 MG tablet Take 20 mg by mouth daily.     . baclofen (LIORESAL) 10 MG tablet Take 10-20 mg by mouth daily as needed for muscle spasms.     . Cholecalciferol (VITAMIN D3) 25 MCG (1000 UT) CAPS Take 1,000 Units by mouth daily.     . clindamycin (CLEOCIN T) 1 % external solution Apply 1 application topically daily as needed. Scalp condiction    . doxycycline (VIBRAMYCIN) 50 MG capsule Take 50 mg by mouth daily.    . fluticasone (FLONASE) 50 MCG/ACT nasal spray Place 2 sprays into both nostrils daily as needed for allergies.     . metFORMIN (GLUCOPHAGE-XR) 500 MG 24 hr tablet Take 1,000  mg by mouth 2 (two) times daily with breakfast and lunch.     . nortriptyline (PAMELOR) 25 MG capsule Take 25-50 mg by mouth at bedtime as needed for sleep.     . Omega-3 Fatty Acids (FISH OIL) 1200 MG CAPS Take 1,000 mg by mouth 3 (three) times daily.     . pregabalin (LYRICA) 75 MG capsule Take 75-150 mg by mouth See admin instructions. Take 75 mg by mouth at lunch and 150 mg at bedtime    . sodium chloride (OCEAN) 0.65 % SOLN nasal spray Place 1 spray into both nostrils daily as needed for congestion.    . SUMAtriptan (IMITREX) 100 MG tablet Take 50-100 mg by mouth as needed for migraine.    . diphenhydramine-acetaminophen (TYLENOL PM) 25-500 MG TABS tablet Take 2 tablets by mouth at bedtime as needed (sleep).     Marland Kitchen levocetirizine (XYZAL) 5 MG tablet Take 5 mg by mouth daily as needed  for allergies.       Results for orders placed or performed during the hospital encounter of 09/08/20 (from the past 48 hour(s))  Glucose, capillary     Status: None   Collection Time: 09/08/20  8:10 AM  Result Value Ref Range   Glucose-Capillary 97 70 - 99 mg/dL    Comment: Glucose reference range applies only to samples taken after fasting for at least 8 hours.   No results found.  Review of Systems  Musculoskeletal: Positive for arthralgias.  Neurological: Positive for numbness.    Blood pressure (!) 143/75, pulse (!) 57, temperature 98.1 F (36.7 C), temperature source Oral, resp. rate 20, height 5\' 7"  (1.702 m), weight 67.6 kg, SpO2 100 %. Physical Exam HENT:     Head: Normocephalic.     Nose: Nose normal.     Mouth/Throat:     Mouth: Mucous membranes are moist.  Eyes:     Pupils: Pupils are equal, round, and reactive to light.  Cardiovascular:     Rate and Rhythm: Normal rate.  Pulmonary:     Effort: Pulmonary effort is normal.  Abdominal:     General: Bowel sounds are normal.  Musculoskeletal:        General: Normal range of motion.  Skin:    General: Skin is warm.  Neurological:     Mental Status: He is alert.     Comments: Strength 5/5 iliopsoas, quads, hamstrings, gastroc, tibialis, and EHL.      Assessment/Plan 54 year old presents for lumbar microdiscectomy L5-S1 left  Elaina Hoops, MD 09/08/2020, 9:31 AM

## 2020-09-08 NOTE — Progress Notes (Signed)
OT Cancellation Note  Patient Details Name: [name] MRN: [id number] DOB: 06-03-1966   Cancelled Treatment:    Reason Eval/Treat Not Completed: OT screened, no needs identified, will sign off. Pt with recent back surgery and able to demonstrate precautions to PT  [facility], OT/L   Acute OT Clinical Specialist [place] Pager [phone] Office [phone]  09/08/2020, 3:35 PM

## 2020-09-08 NOTE — Discharge Instructions (Signed)
Wound Care  Keep the incision clean and dry remove the outer dressing in 2 days, leave the Steri-Strips intact.  Do not put any creams, lotions, or ointments on incision. Leave steri-strips on back.  They will fall off by themselves.  Activity Walk each and every day, increasing distance each day. No lifting greater than 5 lbs.  No lifting no bending no twisting no driving or riding a car unless coming back and forth to see me.  Diet Resume your normal diet.   Return to Work Will be discussed at you follow up appointment.  Call Your Doctor If Any of These Occur Redness, drainage, or swelling at the wound.  Temperature greater than 101 degrees. Severe pain not relieved by pain medication. Incision starts to come apart. Follow Up Appt Call today for appointment in 1-2 weeks ([phone]) or for problems.

## 2020-09-08 NOTE — Transfer of Care (Signed)
Immediate Anesthesia Transfer of Care Note  Patient: [name]  Procedure(s) Performed: Microdiscectomy - left - Lumbar five-Sacral one (Left Back)  Patient Location: PACU  Anesthesia Type:General  Level of Consciousness: drowsy and patient cooperative  Airway & Oxygen Therapy: Patient Spontanous Breathing and Patient connected to nasal cannula oxygen  Post-op Assessment: Report given to RN and Post -op Vital signs reviewed and stable  Post vital signs: Reviewed and stable  Last Vitals:  Vitals Value Taken Time  BP 158/85 09/08/20 1131  Temp    Pulse 66 09/08/20 1134  Resp 8 09/08/20 1134  SpO2 100 % 09/08/20 1134  Vitals shown include unvalidated device data.  Last Pain:  Vitals:   09/08/20 0823  TempSrc:   PainSc: 3       Patients Stated Pain Goal: 5 (54/[birth year] 7619)  Complications: No complications documented.

## 2020-09-08 NOTE — Anesthesia Preprocedure Evaluation (Addendum)
Anesthesia Evaluation  Patient identified by MRN, date of birth, ID band Patient awake    Reviewed: Allergy & Precautions, NPO status , Patient's Chart, lab work & pertinent test results  History of Anesthesia Complications Negative for: history of anesthetic complications  Airway Mallampati: II  TM Distance: >3 FB Neck ROM: Full    Dental  (+) Dental Advisory Given, Teeth Intact   Pulmonary Sleep apnea: denies. ,    Pulmonary exam normal        Cardiovascular negative cardio ROS Normal cardiovascular exam     Neuro/Psych  Headaches, PSYCHIATRIC DISORDERS Depression  Neuromuscular disease (neuropathy)    GI/Hepatic negative GI ROS, Neg liver ROS,   Endo/Other  diabetes, Type 2, Oral Hypoglycemic Agents  Renal/GU negative Renal ROS     Musculoskeletal  (+) Arthritis , Fibromyalgia -  Abdominal   Peds  Hematology  (+) Blood dyscrasia (hemochromatosis), ,   Anesthesia Other Findings Covid test negative   Reproductive/Obstetrics                            Anesthesia Physical Anesthesia Plan  ASA: II  Anesthesia Plan: General   Post-op Pain Management:    Induction: Intravenous  PONV Risk Score and Plan: 3 and Treatment may vary due to age or medical condition, Ondansetron, Dexamethasone and Midazolam  Airway Management Planned: Oral ETT  Additional Equipment: None  Intra-op Plan:   Post-operative Plan: Extubation in OR  Informed Consent: I have reviewed the patients History and Physical, chart, labs and discussed the procedure including the risks, benefits and alternatives for the proposed anesthesia with the patient or authorized representative who has indicated his/her understanding and acceptance.     Dental advisory given  Plan Discussed with: CRNA and Anesthesiologist  Anesthesia Plan Comments:        Anesthesia Quick Evaluation

## 2020-09-08 NOTE — Op Note (Signed)
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  Patient had no back pain had no evidence of instability so I recommended just to microdiscectomy.  I extensively over the risks and benefits of the operation with him as well as perioperative course expectations of outcome and alternatives to surgery and he understood and agreed to proceed forward.  Operative procedure: Patient was brought into the OR was due to general anesthesia positioned prone Wilson frame his back was prepped and draped in routine sterile fashion his old incision was opened in the inferior aspect and extended slightly inferiorly.  Subperiosteal dissection was carried out and what was believed to be the L5 lamina however this confirmed to be S1 with Intra-Op x-ray so attention was taken 1 interspace above this and then the inferior aspect of lamina of L5 medial facet complex superior aspect of lamina S1 was drilled out with a high-speed drill laminotomy was begun with a 2 and 3 uh Kerrison punch ligament flow was identified removed in piecemeal fashion then under microscopic mentation further underbite of the medial facet allowed identification palpation of the S1 pedicle and then identified the S1 nerve root dissected off of the disc space and the disc fragment was still partially contained within the annulus running into the disc base so I incised the annulus into the disc base then worked inferiorly removed  several large free fragments underneath the S1 nerve root performed microdiscectomy and foraminotomies and after discectomy and decompression was no further stenosis on thecal sac or left S1 nerve root wounds are copiously irrigated to Kassim states was maintained Gelfoam was ON top of the dura the muscle fascia approximate layers with interrupted Vicryl skin was closed running 4 subcuticular Dermabond benzoin Steri-Strips and a sterile dressing was applied and patient recovery room in stable condition.  At the end the case all needle counts and sponge counts were correct.

## 2020-09-08 NOTE — Anesthesia Procedure Notes (Signed)
Procedure Name: Intubation Date/Time: 09/08/2020 10:00 AM Performed by: Eligha Bridegroom, CRNA Pre-anesthesia Checklist: Patient identified, Emergency Drugs available, Suction available, Patient being monitored and Timeout performed Patient Re-evaluated:Patient Re-evaluated prior to induction Oxygen Delivery Method: Circle system utilized Preoxygenation: Pre-oxygenation with 100% oxygen Induction Type: IV induction Ventilation: Mask ventilation without difficulty Laryngoscope Size: Mac and 4 Grade View: Grade II Tube type: Oral Tube size: 7.5 mm Number of attempts: 1 Airway Equipment and Method: Stylet Placement Confirmation: ETT inserted through vocal cords under direct vision,  positive ETCO2 and breath sounds checked- equal and bilateral Secured at: 22 cm Tube secured with: Tape Dental Injury: Teeth and Oropharynx as per pre-operative assessment

## 2020-09-08 NOTE — Plan of Care (Signed)
Patient alert and oriented, mae's well, voiding adequate amount of urine, swallowing without difficulty, no c/o pain at time of discharge. Patient discharged home with family. Script and discharged instructions given to patient. Patient and family stated understanding of instructions given. Patient has an appointment with Dr. Cram 

## 2020-09-08 NOTE — Anesthesia Postprocedure Evaluation (Signed)
Anesthesia Post Note  Patient: [name]  Procedure(s) Performed: Microdiscectomy - left - Lumbar five-Sacral one (Left Back)     Patient location during evaluation: PACU Anesthesia Type: General Level of consciousness: awake and alert Pain management: pain level controlled Vital Signs Assessment: post-procedure vital signs reviewed and stable Respiratory status: spontaneous breathing, nonlabored ventilation and respiratory function stable Cardiovascular status: blood pressure returned to baseline and stable Postop Assessment: no apparent nausea or vomiting Anesthetic complications: no   No complications documented.  Last Vitals:  Vitals:   09/08/20 1215 09/08/20 1230  BP: 136/83 134/74  Pulse: (!) 52 (!) 57  Resp: (!) 8 14  Temp:  (!) 36.1 C  SpO2: 100% 100%    Last Pain:  Vitals:   09/08/20 1230  TempSrc:   PainSc: 0-No pain                 Audry Pili

## 2020-09-09 ENCOUNTER — Encounter (HOSPITAL_COMMUNITY): Payer: Self-pay | Admitting: Neurosurgery

## 2020-09-11 NOTE — Addendum Note (Signed)
Addendum  created 09/11/20 1031 by Wilburn Cornelia, CRNA   Intraprocedure Event edited

## 2020-09-29 ENCOUNTER — Other Ambulatory Visit (INDEPENDENT_AMBULATORY_CARE_PROVIDER_SITE_OTHER): Payer: BC Managed Care – PPO

## 2020-09-29 LAB — CBC
HCT: 39.9 % (ref 39.0–52.0)
Hemoglobin: 14.3 g/dL (ref 13.0–17.0)
MCHC: 35.7 g/dL (ref 30.0–36.0)
MCV: 92.2 fl (ref 78.0–100.0)
Platelets: 259 10*3/uL (ref 150.0–400.0)
RBC: 4.33 Mil/uL (ref 4.22–5.81)
RDW: 13.2 % (ref 11.5–15.5)
WBC: 6 10*3/uL (ref 4.0–10.5)

## 2020-09-29 LAB — FERRITIN: Ferritin: 23.2 ng/mL (ref 22.0–322.0)

## 2020-10-03 ENCOUNTER — Other Ambulatory Visit: Payer: Self-pay

## 2020-10-13 ENCOUNTER — Ambulatory Visit: Payer: BC Managed Care – PPO | Attending: Internal Medicine

## 2020-10-13 DIAGNOSIS — Z23 Encounter for immunization: Secondary | ICD-10-CM

## 2020-10-13 NOTE — Progress Notes (Signed)
   Covid-19 Vaccination Clinic  Name:  Daimion Adamcik Oppenheimer    MRN: [id number] DOB: 1965-11-11  10/13/2020  Mr. Huot was observed post Covid-19 immunization for 15 minutes without incident. He was provided with Vaccine Information Sheet and instruction to access the V-Safe system.   Mr. Penrod was instructed to call 911 with any severe reactions post vaccine: Marland Kitchen Difficulty breathing  . Swelling of face and throat  . A fast heartbeat  . A bad rash all over body  . Dizziness and weakness   Immunizations Administered    Name Date Dose VIS Date Route   Pfizer COVID-19 Vaccine 10/13/2020  3:17 PM 0.3 mL 08/16/2020 Intramuscular   Manufacturer: [place]   Lot: [id number]   [place]: [id number]

## 2020-12-12 ENCOUNTER — Other Ambulatory Visit: Payer: Self-pay

## 2020-12-12 ENCOUNTER — Other Ambulatory Visit (INDEPENDENT_AMBULATORY_CARE_PROVIDER_SITE_OTHER): Payer: BC Managed Care – PPO

## 2020-12-12 LAB — CBC
HCT: 42.4 % (ref 39.0–52.0)
Hemoglobin: 15 g/dL (ref 13.0–17.0)
MCHC: 35.4 g/dL (ref 30.0–36.0)
MCV: 93.7 fl (ref 78.0–100.0)
Platelets: 202 10*3/uL (ref 150.0–400.0)
RBC: 4.53 Mil/uL (ref 4.22–5.81)
RDW: 13.2 % (ref 11.5–15.5)
WBC: 5.4 10*3/uL (ref 4.0–10.5)

## 2020-12-12 LAB — FERRITIN: Ferritin: 15.3 ng/mL — ABNORMAL LOW (ref 22.0–322.0)

## 2021-03-20 ENCOUNTER — Other Ambulatory Visit (INDEPENDENT_AMBULATORY_CARE_PROVIDER_SITE_OTHER): Payer: BC Managed Care – PPO

## 2021-03-20 ENCOUNTER — Other Ambulatory Visit: Payer: Self-pay | Admitting: *Deleted

## 2021-03-20 LAB — CBC
HCT: 40.6 % (ref 39.0–52.0)
Hemoglobin: 14.1 g/dL (ref 13.0–17.0)
MCHC: 34.6 g/dL (ref 30.0–36.0)
MCV: 92.7 fl (ref 78.0–100.0)
Platelets: 218 10*3/uL (ref 150.0–400.0)
RBC: 4.38 Mil/uL (ref 4.22–5.81)
RDW: 13.3 % (ref 11.5–15.5)
WBC: 4.9 10*3/uL (ref 4.0–10.5)

## 2021-03-20 LAB — FERRITIN: Ferritin: 11.2 ng/mL — ABNORMAL LOW (ref 22.0–322.0)

## 2021-03-21 ENCOUNTER — Other Ambulatory Visit: Payer: Self-pay

## 2021-07-12 ENCOUNTER — Other Ambulatory Visit: Payer: Self-pay | Admitting: Student

## 2021-07-12 DIAGNOSIS — M4316 Spondylolisthesis, lumbar region: Secondary | ICD-10-CM

## 2021-07-28 HISTORY — PX: LUMBAR LAMINECTOMY: SHX95

## 2021-08-01 ENCOUNTER — Ambulatory Visit
Admission: RE | Admit: 2021-08-01 | Discharge: 2021-08-01 | Disposition: A | Discharge status: Home / Self Care | Payer: BC Managed Care – PPO | Source: Ambulatory Visit | Attending: Student | Admitting: Student

## 2021-08-01 ENCOUNTER — Other Ambulatory Visit: Payer: Self-pay

## 2021-08-01 DIAGNOSIS — M4316 Spondylolisthesis, lumbar region: Secondary | ICD-10-CM

## 2021-08-14 ENCOUNTER — Other Ambulatory Visit: Payer: Self-pay | Admitting: Neurosurgery

## 2021-08-20 NOTE — Pre-Procedure Instructions (Signed)
Surgical Instructions    Your procedure is scheduled on Friday 08/24/21.   Report to [facility] Main Entrance "A" at 07:50 A.M., then check in with the Admitting office.  Call this number if you have problems the morning of surgery:  [phone]   If you have any questions prior to your surgery date call [phone]: Open Monday-Friday 8am-4pm    Remember:  Do not eat after midnight the night before your surgery  You may drink clear liquids until 06:50 A.M. the morning of your surgery.   Clear liquids allowed are: Water, Non-Citrus Juices (without pulp), Carbonated Beverages, Clear Tea, Black Coffee ONLY (NO MILK, CREAM OR POWDERED CREAMER of any kind), and Gatorade    Take these medicines the morning of surgery with A SIP OF WATER   atorvastatin (LIPITOR)   pregabalin (LYRICA)   Take these medicines if needed:   acetaminophen (TYLENOL)  baclofen (LIORESAL)   fluticasone (FLONASE)  SUMAtriptan (IMITREX)  As of today, STOP taking any Aspirin (unless otherwise instructed by your surgeon) Aleve, Naproxen, Ibuprofen, Motrin, Advil, Goody's, BC's, all herbal medications, fish oil, and all vitamins.  WHAT DO I DO ABOUT MY DIABETES MEDICATION?   Do not take oral diabetes medicines (pills) the morning of surgery.  DO NOT TAKE metFORMIN (GLUCOPHAGE-XR) the morning of surgery.  The day of surgery, do not take other diabetes injectables, including Byetta (exenatide), Bydureon (exenatide ER), Victoza (liraglutide), or Trulicity (dulaglutide).  HOW TO MANAGE YOUR DIABETES BEFORE AND AFTER SURGERY  Why is it important to control my blood sugar before and after surgery? Improving blood sugar levels before and after surgery helps healing and can limit problems. A way of improving blood sugar control is eating a healthy diet by:  Eating less sugar and carbohydrates  Increasing activity/exercise  Talking with your doctor about reaching your blood sugar goals High blood sugars (greater  than 180 mg/dL) can raise your risk of infections and slow your recovery, so you will need to focus on controlling your diabetes during the weeks before surgery. Make sure that the doctor who takes care of your diabetes knows about your planned surgery including the date and location.  How do I manage my blood sugar before surgery? Check your blood sugar at least 4 times a day, starting 2 days before surgery, to make sure that the level is not too high or low.  Check your blood sugar the morning of your surgery when you wake up and every 2 hours until you get to the Short Stay unit.  If your blood sugar is less than 70 mg/dL, you will need to treat for low blood sugar: Do not take insulin. Treat a low blood sugar (less than 70 mg/dL) with  cup of clear juice (cranberry or apple), 4 glucose tablets, OR glucose gel. Recheck blood sugar in 15 minutes after treatment (to make sure it is greater than 70 mg/dL). If your blood sugar is not greater than 70 mg/dL on recheck, call [phone] for further instructions. Report your blood sugar to the short stay nurse when you get to Short Stay.  If you are admitted to the hospital after surgery: Your blood sugar will be checked by the staff and you will probably be given insulin after surgery (instead of oral diabetes medicines) to make sure you have good blood sugar levels. The goal for blood sugar control after surgery is 80-180 mg/dL.     After your COVID test   You are not required to quarantine  however you are required to wear a well-fitting mask when you are out and around people not in your household.  If your mask becomes wet or soiled, replace with a new one.  Wash your hands often with soap and water for 20 seconds or clean your hands with an alcohol-based hand sanitizer that contains at least 60% alcohol.  Do not share personal items.  Notify your provider: if you are in close contact with someone who has COVID  or if you develop a  fever of 100.4 or greater, sneezing, cough, sore throat, shortness of breath or body aches.             Do not wear jewelry or makeup Do not wear lotions, powders, perfumes/colognes, or deodorant. Do not shave 48 hours prior to surgery.  Men may shave face and neck. Do not bring valuables to the hospital. DO Not wear nail polish, gel polish, artificial nails, or any other type of covering on natural nails including finger and toenails. If patients have artificial nails, gel coating, etc. that need to be removed by a nail salon, please have this removed prior to surgery or surgery may need to be canceled/delayed if the surgeon/ anesthesia feels like the patient is unable to be adequately monitored.             [facility] is not responsible for any belongings or valuables.  Do NOT Smoke (Tobacco/Vaping)  24 hours prior to your procedure  If you use a CPAP at night, you may bring your mask for your overnight stay.   Contacts, glasses, hearing aids, dentures or partials may not be worn into surgery, please bring cases for these belongings   For patients admitted to the hospital, discharge time will be determined by your treatment team.   Patients discharged the day of surgery will not be allowed to drive home, and someone needs to stay with them for 24 hours.  NO VISITORS WILL BE ALLOWED IN PRE-OP WHERE PATIENTS ARE PREPPED FOR SURGERY.  ONLY 1 SUPPORT PERSON MAY BE PRESENT IN THE WAITING ROOM WHILE YOU ARE IN SURGERY.  IF YOU ARE TO BE ADMITTED, ONCE YOU ARE IN YOUR ROOM YOU WILL BE ALLOWED TWO (2) VISITORS. 1 (ONE) VISITOR MAY STAY OVERNIGHT BUT MUST ARRIVE TO THE ROOM BY 8pm.  Minor children may have two parents present. Special consideration for safety and communication needs will be reviewed on a case by case basis.  Special instructions:    Oral Hygiene is also important to reduce your risk of infection.  Remember - BRUSH YOUR TEETH THE MORNING OF SURGERY WITH YOUR REGULAR  TOOTHPASTE   [facility]- Preparing For Surgery  Before surgery, you can play an important role. Because skin is not sterile, your skin needs to be as free of germs as possible. You can reduce the number of germs on your skin by washing with CHG (chlorahexidine gluconate) Soap before surgery.  CHG is an antiseptic cleaner which kills germs and bonds with the skin to continue killing germs even after washing.     Please do not use if you have an allergy to CHG or antibacterial soaps. If your skin becomes reddened/irritated stop using the CHG.  Do not shave (including legs and underarms) for at least 48 hours prior to first CHG shower. It is OK to shave your face.  Please follow these instructions carefully.     Shower the NIGHT BEFORE SURGERY and the MORNING OF SURGERY with CHG Soap.  If you chose to wash your hair, wash your hair first as usual with your normal shampoo. After you shampoo, rinse your hair and body thoroughly to remove the shampoo.  Then [employer] and genitals (private parts) with your normal soap and rinse thoroughly to remove soap.  After that Use CHG Soap as you would any other liquid soap. You can apply CHG directly to the skin and wash gently with a scrungie or a clean washcloth.   Apply the CHG Soap to your body ONLY FROM THE NECK DOWN.  Do not use on open wounds or open sores. Avoid contact with your eyes, ears, mouth and genitals (private parts). Wash Face and genitals (private parts)  with your normal soap.   Wash thoroughly, paying special attention to the area where your surgery will be performed.  Thoroughly rinse your body with warm water from the neck down.  DO NOT shower/wash with your normal soap after using and rinsing off the CHG Soap.  Pat yourself dry with a CLEAN TOWEL.  Wear CLEAN PAJAMAS to bed the night before surgery  Place CLEAN SHEETS on your bed the night before your surgery  DO NOT SLEEP WITH PETS.   Day of Surgery:  Take a shower  with CHG soap. Wear Clean/Comfortable clothing the morning of surgery Do not apply any deodorants/lotions.   Remember to brush your teeth WITH YOUR REGULAR TOOTHPASTE.   Please read over the following fact sheets that you were given.

## 2021-08-21 ENCOUNTER — Encounter (HOSPITAL_COMMUNITY): Payer: Self-pay

## 2021-08-21 ENCOUNTER — Other Ambulatory Visit: Payer: Self-pay

## 2021-08-21 ENCOUNTER — Encounter (HOSPITAL_COMMUNITY)
Admission: RE | Admit: 2021-08-21 | Discharge: 2021-08-21 | Disposition: A | Discharge status: Home / Self Care | Payer: BC Managed Care – PPO | Source: Ambulatory Visit | Attending: Neurosurgery | Admitting: Neurosurgery

## 2021-08-21 VITALS — BP 134/85 | HR 74 | Temp 97.6°F | Resp 18 | Ht 67.0 in | Wt 148.7 lb

## 2021-08-21 DIAGNOSIS — N200 Calculus of kidney: Secondary | ICD-10-CM | POA: Diagnosis not present

## 2021-08-21 DIAGNOSIS — E1169 Type 2 diabetes mellitus with other specified complication: Secondary | ICD-10-CM | POA: Diagnosis not present

## 2021-08-21 DIAGNOSIS — M48061 Spinal stenosis, lumbar region without neurogenic claudication: Secondary | ICD-10-CM | POA: Diagnosis present

## 2021-08-21 DIAGNOSIS — M5137 Other intervertebral disc degeneration, lumbosacral region: Secondary | ICD-10-CM | POA: Diagnosis not present

## 2021-08-21 DIAGNOSIS — Z01818 Encounter for other preprocedural examination: Secondary | ICD-10-CM

## 2021-08-21 DIAGNOSIS — Z79899 Other long term (current) drug therapy: Secondary | ICD-10-CM | POA: Diagnosis not present

## 2021-08-21 DIAGNOSIS — E119 Type 2 diabetes mellitus without complications: Secondary | ICD-10-CM | POA: Diagnosis not present

## 2021-08-21 DIAGNOSIS — Z01812 Encounter for preprocedural laboratory examination: Secondary | ICD-10-CM | POA: Diagnosis not present

## 2021-08-21 DIAGNOSIS — M4316 Spondylolisthesis, lumbar region: Secondary | ICD-10-CM | POA: Diagnosis not present

## 2021-08-21 DIAGNOSIS — Z20822 Contact with and (suspected) exposure to covid-19: Secondary | ICD-10-CM | POA: Diagnosis not present

## 2021-08-21 DIAGNOSIS — M4807 Spinal stenosis, lumbosacral region: Secondary | ICD-10-CM | POA: Diagnosis not present

## 2021-08-21 DIAGNOSIS — Z981 Arthrodesis status: Secondary | ICD-10-CM | POA: Diagnosis not present

## 2021-08-21 DIAGNOSIS — Z7984 Long term (current) use of oral hypoglycemic drugs: Secondary | ICD-10-CM | POA: Diagnosis not present

## 2021-08-21 LAB — SARS CORONAVIRUS 2 (TAT 6-24 HRS): SARS Coronavirus 2: NEGATIVE

## 2021-08-21 LAB — CBC
HCT: 40.9 % (ref 39.0–52.0)
Hemoglobin: 13.8 g/dL (ref 13.0–17.0)
MCH: 31.9 pg (ref 26.0–34.0)
MCHC: 33.7 g/dL (ref 30.0–36.0)
MCV: 94.7 fL (ref 80.0–100.0)
Platelets: 202 10*3/uL (ref 150–400)
RBC: 4.32 MIL/uL (ref 4.22–5.81)
RDW: 13.2 % (ref 11.5–15.5)
WBC: 5.1 10*3/uL (ref 4.0–10.5)
nRBC: 0 % (ref 0.0–0.2)

## 2021-08-21 LAB — TYPE AND SCREEN
ABO/RH(D): O POS
Antibody Screen: NEGATIVE

## 2021-08-21 LAB — BASIC METABOLIC PANEL
Anion gap: 7 (ref 5–15)
BUN: 9 mg/dL (ref 6–20)
CO2: 32 mmol/L (ref 22–32)
Calcium: 9.7 mg/dL (ref 8.9–10.3)
Chloride: 99 mmol/L (ref 98–111)
Creatinine, Ser: 0.87 mg/dL (ref 0.61–1.24)
GFR, Estimated: >60 mL/min (ref >60)
Glucose, Bld: 163 mg/dL — ABNORMAL HIGH (ref 70–99)
Potassium: 4.3 mmol/L (ref 3.5–5.1)
Sodium: 138 mmol/L (ref 135–145)

## 2021-08-21 LAB — GLUCOSE, CAPILLARY: Glucose-Capillary: 224 mg/dL — ABNORMAL HIGH (ref 70–99)

## 2021-08-21 LAB — SURGICAL PCR SCREEN
MRSA, PCR: NEGATIVE
Staphylococcus aureus: NEGATIVE

## 2021-08-21 LAB — HEMOGLOBIN A1C
Hgb A1c MFr Bld: 5.7 % — ABNORMAL HIGH (ref 4.8–5.6)
Mean Plasma Glucose: 116.89 mg/dL

## 2021-08-21 NOTE — Progress Notes (Signed)
PCP - Derinda Late Cardiologist - denies Gastroenterologist: Dr. Carlean Purl   PPM/ICD - denies   Chest x-ray - n/a EKG - 09/05/20 Stress Test - denies ECHO - denies Cardiac Cath - denies  Sleep Study - over 10 years ago CPAP - used to wear it but lost weight and stopped wearing it because he feels like he doesn't need it anymore  Fasting Blood Sugar - 100-120 Checks Blood Sugar sporadically  Patient instructed to hold all Aspirin, NSAID's, herbal medications, fish oil and vitamins 7 days prior to surgery.   ERAS Protcol -yes PRE-SURGERY Ensure or G2- no  COVID TEST- done in PAT 08/21/21   Anesthesia review: no  Patient denies shortness of breath, fever, cough and chest pain at PAT appointment   All instructions explained to the patient, with a verbal understanding of the material. Patient agrees to go over the instructions while at home for a better understanding. Patient also instructed to self quarantine after being tested for COVID-19. The opportunity to ask questions was provided.

## 2021-08-21 NOTE — Pre-Procedure Instructions (Signed)
Surgical Instructions    Your procedure is scheduled on Friday 08/24/21.   Report to [facility] Main Entrance "A" at 07:50 A.M., then check in with the Admitting office.  Call this number if you have problems the morning of surgery:  [phone]   If you have any questions prior to your surgery date call [phone]: Open Monday-Friday 8am-4pm    Remember:  Do not eat after midnight the night before your surgery  You may drink clear liquids until 06:50 A.M. the morning of your surgery.   Clear liquids allowed are: Water, Non-Citrus Juices (without pulp), Carbonated Beverages, Clear Tea, Black Coffee ONLY (NO MILK, CREAM OR POWDERED CREAMER of any kind), and Gatorade    Take these medicines the morning of surgery with A SIP OF WATER   atorvastatin (LIPITOR)   pregabalin (LYRICA) doxycycline (VIBRAMYCIN)   Take these medicines if needed:   acetaminophen (TYLENOL)  baclofen (LIORESAL)   fluticasone (FLONASE)  SUMAtriptan (IMITREX)  As of today, STOP taking any Aspirin (unless otherwise instructed by your surgeon) Aleve, Naproxen, Ibuprofen, Motrin, Advil, Goody's, BC's, all herbal medications, fish oil, and all vitamins.  WHAT DO I DO ABOUT MY DIABETES MEDICATION?   Do not take oral diabetes medicines (pills) the morning of surgery.  DO NOT TAKE metFORMIN (GLUCOPHAGE-XR) the morning of surgery.  The day of surgery, do not take other diabetes injectables, including Byetta (exenatide), Bydureon (exenatide ER), Victoza (liraglutide), or Trulicity (dulaglutide).  HOW TO MANAGE YOUR DIABETES BEFORE AND AFTER SURGERY  Why is it important to control my blood sugar before and after surgery? Improving blood sugar levels before and after surgery helps healing and can limit problems. A way of improving blood sugar control is eating a healthy diet by:  Eating less sugar and carbohydrates  Increasing activity/exercise  Talking with your doctor about reaching your blood sugar  goals High blood sugars (greater than 180 mg/dL) can raise your risk of infections and slow your recovery, so you will need to focus on controlling your diabetes during the weeks before surgery. Make sure that the doctor who takes care of your diabetes knows about your planned surgery including the date and location.  How do I manage my blood sugar before surgery? Check your blood sugar at least 4 times a day, starting 2 days before surgery, to make sure that the level is not too high or low.  Check your blood sugar the morning of your surgery when you wake up and every 2 hours until you get to the Short Stay unit.  If your blood sugar is less than 70 mg/dL, you will need to treat for low blood sugar: Do not take insulin. Treat a low blood sugar (less than 70 mg/dL) with  cup of clear juice (cranberry or apple), 4 glucose tablets, OR glucose gel. Recheck blood sugar in 15 minutes after treatment (to make sure it is greater than 70 mg/dL). If your blood sugar is not greater than 70 mg/dL on recheck, call [phone] for further instructions. Report your blood sugar to the short stay nurse when you get to Short Stay.  If you are admitted to the hospital after surgery: Your blood sugar will be checked by the staff and you will probably be given insulin after surgery (instead of oral diabetes medicines) to make sure you have good blood sugar levels. The goal for blood sugar control after surgery is 80-180 mg/dL.     After your COVID test   You are not required  to quarantine however you are required to wear a well-fitting mask when you are out and around people not in your household.  If your mask becomes wet or soiled, replace with a new one.  Wash your hands often with soap and water for 20 seconds or clean your hands with an alcohol-based hand sanitizer that contains at least 60% alcohol.  Do not share personal items.  Notify your provider: if you are in close contact with someone who  has COVID  or if you develop a fever of 100.4 or greater, sneezing, cough, sore throat, shortness of breath or body aches.             Do not wear jewelry or makeup Do not wear lotions, powders, perfumes/colognes, or deodorant. Do not shave 48 hours prior to surgery.  Men may shave face and neck. Do not bring valuables to the hospital. DO Not wear nail polish, gel polish, artificial nails, or any other type of covering on natural nails including finger and toenails. If patients have artificial nails, gel coating, etc. that need to be removed by a nail salon, please have this removed prior to surgery or surgery may need to be canceled/delayed if the surgeon/ anesthesia feels like the patient is unable to be adequately monitored.             [facility] is not responsible for any belongings or valuables.  Do NOT Smoke (Tobacco/Vaping)  24 hours prior to your procedure  If you use a CPAP at night, you may bring your mask for your overnight stay.   Contacts, glasses, hearing aids, dentures or partials may not be worn into surgery, please bring cases for these belongings   For patients admitted to the hospital, discharge time will be determined by your treatment team.   Patients discharged the day of surgery will not be allowed to drive home, and someone needs to stay with them for 24 hours.  NO VISITORS WILL BE ALLOWED IN PRE-OP WHERE PATIENTS ARE PREPPED FOR SURGERY.  ONLY 1 SUPPORT PERSON MAY BE PRESENT IN THE WAITING ROOM WHILE YOU ARE IN SURGERY.  IF YOU ARE TO BE ADMITTED, ONCE YOU ARE IN YOUR ROOM YOU WILL BE ALLOWED TWO (2) VISITORS. 1 (ONE) VISITOR MAY STAY OVERNIGHT BUT MUST ARRIVE TO THE ROOM BY 8pm.  Minor children may have two parents present. Special consideration for safety and communication needs will be reviewed on a case by case basis.  Special instructions:    Oral Hygiene is also important to reduce your risk of infection.  Remember - BRUSH YOUR TEETH THE MORNING OF  SURGERY WITH YOUR REGULAR TOOTHPASTE   [facility]- Preparing For Surgery  Before surgery, you can play an important role. Because skin is not sterile, your skin needs to be as free of germs as possible. You can reduce the number of germs on your skin by washing with CHG (chlorahexidine gluconate) Soap before surgery.  CHG is an antiseptic cleaner which kills germs and bonds with the skin to continue killing germs even after washing.     Please do not use if you have an allergy to CHG or antibacterial soaps. If your skin becomes reddened/irritated stop using the CHG.  Do not shave (including legs and underarms) for at least 48 hours prior to first CHG shower. It is OK to shave your face.  Please follow these instructions carefully.     Shower the NIGHT BEFORE SURGERY and the MORNING OF SURGERY with  CHG Soap.   If you chose to wash your hair, wash your hair first as usual with your normal shampoo. After you shampoo, rinse your hair and body thoroughly to remove the shampoo.  Then [employer] and genitals (private parts) with your normal soap and rinse thoroughly to remove soap.  After that Use CHG Soap as you would any other liquid soap. You can apply CHG directly to the skin and wash gently with a scrungie or a clean washcloth.   Apply the CHG Soap to your body ONLY FROM THE NECK DOWN.  Do not use on open wounds or open sores. Avoid contact with your eyes, ears, mouth and genitals (private parts). Wash Face and genitals (private parts)  with your normal soap.   Wash thoroughly, paying special attention to the area where your surgery will be performed.  Thoroughly rinse your body with warm water from the neck down.  DO NOT shower/wash with your normal soap after using and rinsing off the CHG Soap.  Pat yourself dry with a CLEAN TOWEL.  Wear CLEAN PAJAMAS to bed the night before surgery  Place CLEAN SHEETS on your bed the night before your surgery  DO NOT SLEEP WITH PETS.   Day of  Surgery: Take a shower with CHG soap. Wear Clean/Comfortable clothing the morning of surgery Do not apply any deodorants/lotions.   Remember to brush your teeth WITH YOUR REGULAR TOOTHPASTE.   Please read over the following fact sheets that you were given.

## 2021-08-22 ENCOUNTER — Other Ambulatory Visit: Payer: Self-pay | Admitting: Neurosurgery

## 2021-08-24 ENCOUNTER — Ambulatory Visit (HOSPITAL_COMMUNITY): Payer: BC Managed Care – PPO | Admitting: Anesthesiology

## 2021-08-24 ENCOUNTER — Other Ambulatory Visit: Payer: Self-pay

## 2021-08-24 ENCOUNTER — Encounter (HOSPITAL_COMMUNITY): Payer: Self-pay | Admitting: Neurosurgery

## 2021-08-24 ENCOUNTER — Ambulatory Visit (HOSPITAL_COMMUNITY)
Admission: RE | Admit: 2021-08-24 | Discharge: 2021-08-25 | Disposition: A | Discharge status: Home / Self Care | Payer: BC Managed Care – PPO | Attending: Neurosurgery | Admitting: Neurosurgery

## 2021-08-24 ENCOUNTER — Ambulatory Visit (HOSPITAL_COMMUNITY): Payer: BC Managed Care – PPO

## 2021-08-24 ENCOUNTER — Encounter (HOSPITAL_COMMUNITY)
Admission: RE | Disposition: A | Discharge status: Home / Self Care | Payer: Self-pay | Source: Home / Self Care | Attending: Neurosurgery

## 2021-08-24 DIAGNOSIS — Z7984 Long term (current) use of oral hypoglycemic drugs: Secondary | ICD-10-CM | POA: Diagnosis not present

## 2021-08-24 DIAGNOSIS — Z79899 Other long term (current) drug therapy: Secondary | ICD-10-CM | POA: Diagnosis not present

## 2021-08-24 DIAGNOSIS — Z981 Arthrodesis status: Secondary | ICD-10-CM | POA: Insufficient documentation

## 2021-08-24 DIAGNOSIS — M5137 Other intervertebral disc degeneration, lumbosacral region: Secondary | ICD-10-CM | POA: Insufficient documentation

## 2021-08-24 DIAGNOSIS — M4807 Spinal stenosis, lumbosacral region: Secondary | ICD-10-CM | POA: Insufficient documentation

## 2021-08-24 DIAGNOSIS — E119 Type 2 diabetes mellitus without complications: Secondary | ICD-10-CM | POA: Diagnosis not present

## 2021-08-24 DIAGNOSIS — M48061 Spinal stenosis, lumbar region without neurogenic claudication: Secondary | ICD-10-CM | POA: Diagnosis present

## 2021-08-24 DIAGNOSIS — M4316 Spondylolisthesis, lumbar region: Secondary | ICD-10-CM | POA: Diagnosis not present

## 2021-08-24 DIAGNOSIS — Z419 Encounter for procedure for purposes other than remedying health state, unspecified: Secondary | ICD-10-CM

## 2021-08-24 DIAGNOSIS — N200 Calculus of kidney: Secondary | ICD-10-CM | POA: Diagnosis not present

## 2021-08-24 LAB — GLUCOSE, CAPILLARY
Glucose-Capillary: 86 mg/dL (ref 70–99)
Glucose-Capillary: 143 mg/dL — ABNORMAL HIGH (ref 70–99)
Glucose-Capillary: 217 mg/dL — ABNORMAL HIGH (ref 70–99)

## 2021-08-24 SURGERY — POSTERIOR LUMBAR FUSION 1 LEVEL
Anesthesia: General | Site: Back

## 2021-08-24 MED ORDER — ACETAMINOPHEN 500 MG PO TABS: 1000.0000 mg | ORAL_TABLET | Freq: Four times a day (QID) | ORAL | Status: DC | PRN

## 2021-08-24 MED ORDER — CEFAZOLIN SODIUM-DEXTROSE 2-4 GM/100ML-% IV SOLN
2.0000 g | INTRAVENOUS | Status: AC
  Administered 2021-08-24: 2 g via INTRAVENOUS

## 2021-08-24 MED ORDER — PREGABALIN 75 MG PO CAPS
150.0000 mg | ORAL_CAPSULE | Freq: Every day | ORAL | Status: DC
  Administered 2021-08-24: 150 mg via ORAL
  Filled 2021-08-24: qty 2

## 2021-08-24 MED ORDER — BUPIVACAINE HCL (PF) 0.25 % IJ SOLN
INTRAMUSCULAR | Status: AC
  Filled 2021-08-24: qty 30

## 2021-08-24 MED ORDER — LACTATED RINGERS IV SOLN
INTRAVENOUS | Status: DC
Start: 2021-08-24 — End: 2021-08-24

## 2021-08-24 MED ORDER — BUPIVACAINE LIPOSOME 1.3 % IJ SUSP
INTRAMUSCULAR | Status: AC
  Filled 2021-08-24: qty 20

## 2021-08-24 MED ORDER — CHLORHEXIDINE GLUCONATE CLOTH 2 % EX PADS: 6.0000 | MEDICATED_PAD | Freq: Once | CUTANEOUS | Status: DC

## 2021-08-24 MED ORDER — METFORMIN HCL ER 500 MG PO TB24
1000.0000 mg | ORAL_TABLET | Freq: Two times a day (BID) | ORAL | Status: DC
  Administered 2021-08-24 – 2021-08-25 (×2): 1000 mg via ORAL
  Filled 2021-08-24 (×2): qty 2

## 2021-08-24 MED ORDER — BACLOFEN 10 MG PO TABS: 10.0000 mg | ORAL_TABLET | Freq: Two times a day (BID) | ORAL | Status: DC | PRN

## 2021-08-24 MED ORDER — LIDOCAINE 2% (20 MG/ML) 5 ML SYRINGE
INTRAMUSCULAR | Status: AC
  Filled 2021-08-24: qty 5

## 2021-08-24 MED ORDER — LIDOCAINE-EPINEPHRINE 1 %-1:100000 IJ SOLN
INTRAMUSCULAR | Status: AC
  Filled 2021-08-24: qty 1

## 2021-08-24 MED ORDER — SODIUM CHLORIDE 0.9% FLUSH: 3.0000 mL | Freq: Two times a day (BID) | INTRAVENOUS | Status: DC

## 2021-08-24 MED ORDER — SODIUM CHLORIDE 0.9% FLUSH: 3.0000 mL | INTRAVENOUS | Status: DC | PRN

## 2021-08-24 MED ORDER — ACETAMINOPHEN 650 MG RE SUPP: 650.0000 mg | RECTAL | Status: DC | PRN

## 2021-08-24 MED ORDER — PREGABALIN 75 MG PO CAPS
75.0000 mg | ORAL_CAPSULE | Freq: Every day | ORAL | Status: DC
  Administered 2021-08-25: 75 mg via ORAL
  Filled 2021-08-24: qty 1

## 2021-08-24 MED ORDER — ONDANSETRON HCL 4 MG PO TABS: 4.0000 mg | ORAL_TABLET | Freq: Four times a day (QID) | ORAL | Status: DC | PRN

## 2021-08-24 MED ORDER — MIDAZOLAM HCL 5 MG/5ML IJ SOLN
INTRAMUSCULAR | Status: DC | PRN
  Administered 2021-08-24: 2 mg via INTRAVENOUS

## 2021-08-24 MED ORDER — PROPOFOL 10 MG/ML IV BOLUS
INTRAVENOUS | Status: AC
  Filled 2021-08-24: qty 40

## 2021-08-24 MED ORDER — BUPIVACAINE LIPOSOME 1.3 % IJ SUSP
INTRAMUSCULAR | Status: DC | PRN
  Administered 2021-08-24: 20 mL

## 2021-08-24 MED ORDER — MIDAZOLAM HCL 2 MG/2ML IJ SOLN
INTRAMUSCULAR | Status: AC
  Filled 2021-08-24: qty 2

## 2021-08-24 MED ORDER — CYCLOBENZAPRINE HCL 10 MG PO TABS
10.0000 mg | ORAL_TABLET | Freq: Three times a day (TID) | ORAL | Status: DC | PRN
  Administered 2021-08-24: 10 mg via ORAL
  Filled 2021-08-24: qty 1

## 2021-08-24 MED ORDER — LIDOCAINE-EPINEPHRINE 1 %-1:100000 IJ SOLN
INTRAMUSCULAR | Status: DC | PRN
  Administered 2021-08-24: 10 mL

## 2021-08-24 MED ORDER — FLUTICASONE PROPIONATE 50 MCG/ACT NA SUSP
2.0000 | Freq: Every day | NASAL | Status: DC | PRN
  Filled 2021-08-24: qty 16

## 2021-08-24 MED ORDER — FENTANYL CITRATE (PF) 250 MCG/5ML IJ SOLN
INTRAMUSCULAR | Status: AC
  Filled 2021-08-24: qty 5

## 2021-08-24 MED ORDER — ACETAMINOPHEN 325 MG PO TABS
650.0000 mg | ORAL_TABLET | ORAL | Status: DC | PRN
  Administered 2021-08-25 (×2): 650 mg via ORAL
  Filled 2021-08-24 (×2): qty 2

## 2021-08-24 MED ORDER — ATORVASTATIN CALCIUM 10 MG PO TABS
20.0000 mg | ORAL_TABLET | Freq: Every day | ORAL | Status: DC
  Administered 2021-08-25: 20 mg via ORAL
  Filled 2021-08-24: qty 2

## 2021-08-24 MED ORDER — PANTOPRAZOLE SODIUM 40 MG PO TBEC
40.0000 mg | DELAYED_RELEASE_TABLET | Freq: Every day | ORAL | Status: DC
  Administered 2021-08-24: 40 mg via ORAL
  Filled 2021-08-24: qty 1

## 2021-08-24 MED ORDER — LEVOCETIRIZINE DIHYDROCHLORIDE 5 MG PO TABS: 5.0000 mg | ORAL_TABLET | Freq: Every day | ORAL | Status: DC | PRN

## 2021-08-24 MED ORDER — ALUM & MAG HYDROXIDE-SIMETH 200-200-20 MG/5ML PO SUSP: 30.0000 mL | Freq: Four times a day (QID) | ORAL | Status: DC | PRN

## 2021-08-24 MED ORDER — CEFAZOLIN SODIUM-DEXTROSE 2-4 GM/100ML-% IV SOLN
2.0000 g | Freq: Three times a day (TID) | INTRAVENOUS | Status: DC
  Administered 2021-08-24 – 2021-08-25 (×3): 2 g via INTRAVENOUS
  Filled 2021-08-24 (×3): qty 100

## 2021-08-24 MED ORDER — EPHEDRINE SULFATE-NACL 50-0.9 MG/10ML-% IV SOSY
PREFILLED_SYRINGE | INTRAVENOUS | Status: DC | PRN
  Administered 2021-08-24: 5 mg via INTRAVENOUS

## 2021-08-24 MED ORDER — PANTOPRAZOLE SODIUM 40 MG IV SOLR: 40.0000 mg | Freq: Every day | INTRAVENOUS | Status: DC

## 2021-08-24 MED ORDER — CHLORHEXIDINE GLUCONATE 0.12 % MT SOLN: 15.0000 mL | Freq: Once | OROMUCOSAL | Status: AC

## 2021-08-24 MED ORDER — ROCURONIUM BROMIDE 10 MG/ML (PF) SYRINGE
PREFILLED_SYRINGE | INTRAVENOUS | Status: DC | PRN
  Administered 2021-08-24: 30 mg via INTRAVENOUS
  Administered 2021-08-24: 70 mg via INTRAVENOUS
  Administered 2021-08-24: 20 mg via INTRAVENOUS

## 2021-08-24 MED ORDER — SUGAMMADEX SODIUM 200 MG/2ML IV SOLN
INTRAVENOUS | Status: DC | PRN
  Administered 2021-08-24: 400 mg via INTRAVENOUS

## 2021-08-24 MED ORDER — THROMBIN 20000 UNITS EX SOLR
CUTANEOUS | Status: DC | PRN
  Administered 2021-08-24: 20 mL via TOPICAL

## 2021-08-24 MED ORDER — ONDANSETRON HCL 4 MG/2ML IJ SOLN
INTRAMUSCULAR | Status: DC | PRN
  Administered 2021-08-24: 4 mg via INTRAVENOUS

## 2021-08-24 MED ORDER — THROMBIN 5000 UNITS EX SOLR
CUTANEOUS | Status: AC
  Filled 2021-08-24: qty 5000

## 2021-08-24 MED ORDER — ROCURONIUM BROMIDE 10 MG/ML (PF) SYRINGE
PREFILLED_SYRINGE | INTRAVENOUS | Status: AC
  Filled 2021-08-24: qty 10

## 2021-08-24 MED ORDER — CLINDAMYCIN PHOSPHATE 1 % EX SOLN
1.0000 "application " | Freq: Every day | CUTANEOUS | Status: DC | PRN
  Filled 2021-08-24: qty 30

## 2021-08-24 MED ORDER — ONDANSETRON HCL 4 MG/2ML IJ SOLN
INTRAMUSCULAR | Status: AC
  Filled 2021-08-24: qty 2

## 2021-08-24 MED ORDER — 0.9 % SODIUM CHLORIDE (POUR BTL) OPTIME
TOPICAL | Status: DC | PRN
  Administered 2021-08-24: 1000 mL

## 2021-08-24 MED ORDER — SUMATRIPTAN SUCCINATE 50 MG PO TABS
50.0000 mg | ORAL_TABLET | ORAL | Status: DC | PRN
  Filled 2021-08-24: qty 2

## 2021-08-24 MED ORDER — OMEGA-3-ACID ETHYL ESTERS 1 G PO CAPS
1.0000 g | ORAL_CAPSULE | Freq: Every day | ORAL | Status: DC
  Administered 2021-08-24 – 2021-08-25 (×2): 1 g via ORAL
  Filled 2021-08-24 (×2): qty 1

## 2021-08-24 MED ORDER — DEXAMETHASONE SODIUM PHOSPHATE 10 MG/ML IJ SOLN
10.0000 mg | Freq: Once | INTRAMUSCULAR | Status: AC
  Administered 2021-08-24: 10 mg via INTRAVENOUS

## 2021-08-24 MED ORDER — HYDROMORPHONE HCL 1 MG/ML IJ SOLN: 0.5000 mg | INTRAMUSCULAR | Status: DC | PRN

## 2021-08-24 MED ORDER — FENTANYL CITRATE (PF) 100 MCG/2ML IJ SOLN
INTRAMUSCULAR | Status: DC | PRN
  Administered 2021-08-24 (×2): 100 ug via INTRAVENOUS

## 2021-08-24 MED ORDER — OXYCODONE HCL 5 MG PO TABS
10.0000 mg | ORAL_TABLET | ORAL | Status: DC | PRN
  Administered 2021-08-24 – 2021-08-25 (×5): 10 mg via ORAL
  Filled 2021-08-24 (×5): qty 2

## 2021-08-24 MED ORDER — DOXYCYCLINE HYCLATE 50 MG PO CAPS
50.0000 mg | ORAL_CAPSULE | Freq: Every day | ORAL | Status: DC
  Administered 2021-08-25: 50 mg via ORAL
  Filled 2021-08-24: qty 1

## 2021-08-24 MED ORDER — PROPOFOL 10 MG/ML IV BOLUS
INTRAVENOUS | Status: DC | PRN
  Administered 2021-08-24: 150 mg via INTRAVENOUS

## 2021-08-24 MED ORDER — PHENOL 1.4 % MT LIQD: 1.0000 | OROMUCOSAL | Status: DC | PRN

## 2021-08-24 MED ORDER — CETIRIZINE HCL 10 MG PO TABS
5.0000 mg | ORAL_TABLET | Freq: Every day | ORAL | Status: DC | PRN
  Administered 2021-08-25: 5 mg via ORAL
  Filled 2021-08-24 (×2): qty 1

## 2021-08-24 MED ORDER — ONDANSETRON HCL 4 MG/2ML IJ SOLN: 4.0000 mg | Freq: Four times a day (QID) | INTRAMUSCULAR | Status: DC | PRN

## 2021-08-24 MED ORDER — FENTANYL CITRATE (PF) 100 MCG/2ML IJ SOLN
25.0000 ug | INTRAMUSCULAR | Status: DC | PRN
  Administered 2021-08-24 (×2): 50 ug via INTRAVENOUS

## 2021-08-24 MED ORDER — OXYCODONE-ACETAMINOPHEN 5-325 MG PO TABS: 1.0000 | ORAL_TABLET | ORAL | Status: DC | PRN

## 2021-08-24 MED ORDER — NORTRIPTYLINE HCL 25 MG PO CAPS
25.0000 mg | ORAL_CAPSULE | Freq: Every evening | ORAL | Status: DC | PRN
  Filled 2021-08-24: qty 1

## 2021-08-24 MED ORDER — DEXAMETHASONE SODIUM PHOSPHATE 10 MG/ML IJ SOLN
INTRAMUSCULAR | Status: AC
  Filled 2021-08-24: qty 1

## 2021-08-24 MED ORDER — LACTATED RINGERS IV SOLN: INTRAVENOUS | Status: DC | PRN

## 2021-08-24 MED ORDER — VITAMIN D 25 MCG (1000 UNIT) PO TABS
1000.0000 [IU] | ORAL_TABLET | Freq: Every day | ORAL | Status: DC
  Administered 2021-08-24 – 2021-08-25 (×2): 1000 [IU] via ORAL
  Filled 2021-08-24 (×2): qty 1

## 2021-08-24 MED ORDER — CEFAZOLIN SODIUM-DEXTROSE 2-4 GM/100ML-% IV SOLN
INTRAVENOUS | Status: AC
  Filled 2021-08-24: qty 100

## 2021-08-24 MED ORDER — THROMBIN 20000 UNITS EX SOLR
CUTANEOUS | Status: AC
  Filled 2021-08-24: qty 20000

## 2021-08-24 MED ORDER — LIDOCAINE 2% (20 MG/ML) 5 ML SYRINGE
INTRAMUSCULAR | Status: DC | PRN
  Administered 2021-08-24: 60 mg via INTRAVENOUS

## 2021-08-24 MED ORDER — ORAL CARE MOUTH RINSE: 15.0000 mL | Freq: Once | OROMUCOSAL | Status: AC

## 2021-08-24 MED ORDER — FENTANYL CITRATE (PF) 100 MCG/2ML IJ SOLN
INTRAMUSCULAR | Status: AC
  Filled 2021-08-24: qty 2

## 2021-08-24 MED ORDER — PHENYLEPHRINE HCL-NACL 20-0.9 MG/250ML-% IV SOLN
INTRAVENOUS | Status: DC | PRN
  Administered 2021-08-24: 25 ug/min via INTRAVENOUS

## 2021-08-24 MED ORDER — MENTHOL 3 MG MT LOZG: 1.0000 | LOZENGE | OROMUCOSAL | Status: DC | PRN

## 2021-08-24 MED ORDER — CHLORHEXIDINE GLUCONATE 0.12 % MT SOLN
OROMUCOSAL | Status: AC
  Administered 2021-08-24: 15 mL via OROMUCOSAL
  Filled 2021-08-24: qty 15

## 2021-08-24 MED ORDER — SODIUM CHLORIDE 0.9 % IV SOLN: 250.0000 mL | INTRAVENOUS | Status: DC

## 2021-08-24 SURGICAL SUPPLY — 74 items
ADH SKN CLS APL DERMABOND .7 (GAUZE/BANDAGES/DRESSINGS) ×1
APL SKNCLS STERI-STRIP NONHPOA (GAUZE/BANDAGES/DRESSINGS) ×1
BAG COUNTER SPONGE SURGICOUNT (BAG) ×3 IMPLANT
BAG SPNG CNTER NS LX DISP (BAG) ×2
BASKET BONE COLLECTION (BASKET) ×2 IMPLANT
BENZOIN TINCTURE PRP APPL 2/3 (GAUZE/BANDAGES/DRESSINGS) ×2 IMPLANT
BLADE CLIPPER SURG (BLADE) ×2 IMPLANT
BLADE SURG 11 STRL SS (BLADE) ×2 IMPLANT
BONE VIVIGEN FORMABLE 5.4CC (Bone Implant) ×2 IMPLANT
BUR CUTTER 7.0 ROUND (BURR) ×2 IMPLANT
BUR MATCHSTICK NEURO 3.0 LAGG (BURR) ×2 IMPLANT
CANISTER SUCT 3000ML PPV (MISCELLANEOUS) ×2 IMPLANT
CAP LOCKING (Cap) ×12 IMPLANT
CAP LOCKING 5.5 CREO (Cap) IMPLANT
CARTRIDGE OIL MAESTRO DRILL (MISCELLANEOUS) ×1 IMPLANT
CNTNR URN SCR LID CUP LEK RST (MISCELLANEOUS) ×1 IMPLANT
CONT SPEC 4OZ STRL OR WHT (MISCELLANEOUS) ×2
COVER BACK TABLE 60X90IN (DRAPES) ×2 IMPLANT
DECANTER SPIKE VIAL GLASS SM (MISCELLANEOUS) ×2 IMPLANT
DERMABOND ADVANCED (GAUZE/BANDAGES/DRESSINGS) ×1
DERMABOND ADVANCED .7 DNX12 (GAUZE/BANDAGES/DRESSINGS) ×1 IMPLANT
DIFFUSER DRILL AIR PNEUMATIC (MISCELLANEOUS) ×2 IMPLANT
DRAPE C-ARM 42X72 X-RAY (DRAPES) ×4 IMPLANT
DRAPE C-ARMOR (DRAPES) IMPLANT
DRAPE HALF SHEET 40X57 (DRAPES) IMPLANT
DRAPE LAPAROTOMY 100X72X124 (DRAPES) ×2 IMPLANT
DRAPE SURG 17X23 STRL (DRAPES) ×2 IMPLANT
DRSG OPSITE 4X5.5 SM (GAUZE/BANDAGES/DRESSINGS) ×2 IMPLANT
DRSG OPSITE POSTOP 4X6 (GAUZE/BANDAGES/DRESSINGS) ×2 IMPLANT
DURAPREP 26ML APPLICATOR (WOUND CARE) ×2 IMPLANT
ELECT REM PT RETURN 9FT ADLT (ELECTROSURGICAL) ×2
ELECTRODE REM PT RTRN 9FT ADLT (ELECTROSURGICAL) ×1 IMPLANT
EVACUATOR 1/8 PVC DRAIN (DRAIN) ×1 IMPLANT
EVACUATOR 3/16  PVC DRAIN (DRAIN)
EVACUATOR 3/16 PVC DRAIN (DRAIN) IMPLANT
GAUZE 4X4 16PLY ~~LOC~~+RFID DBL (SPONGE) IMPLANT
GAUZE SPONGE 4X4 12PLY STRL (GAUZE/BANDAGES/DRESSINGS) ×2 IMPLANT
GLOVE EXAM NITRILE XL STR (GLOVE) IMPLANT
GLOVE SURG ENC MOIS LTX SZ7 (GLOVE) IMPLANT
GLOVE SURG ENC MOIS LTX SZ8 (GLOVE) ×4 IMPLANT
GLOVE SURG UNDER LTX SZ8.5 (GLOVE) ×4 IMPLANT
GLOVE SURG UNDER POLY LF SZ7 (GLOVE) ×3 IMPLANT
GOWN STRL REUS W/ TWL LRG LVL3 (GOWN DISPOSABLE) ×3 IMPLANT
GOWN STRL REUS W/ TWL XL LVL3 (GOWN DISPOSABLE) ×2 IMPLANT
GOWN STRL REUS W/TWL 2XL LVL3 (GOWN DISPOSABLE) IMPLANT
GOWN STRL REUS W/TWL LRG LVL3 (GOWN DISPOSABLE) ×6
GOWN STRL REUS W/TWL XL LVL3 (GOWN DISPOSABLE) ×4
GRAFT BNE MATRIX VG FRMBL MD 5 (Bone Implant) IMPLANT
GRAFT BONE PROTEIOS LRG 5CC (Orthopedic Implant) ×1 IMPLANT
KIT BASIN OR (CUSTOM PROCEDURE TRAY) ×2 IMPLANT
KIT GRAFTMAG DEL NEURO DISP (NEUROSURGERY SUPPLIES) IMPLANT
KIT TURNOVER KIT B (KITS) ×2 IMPLANT
MILL MEDIUM DISP (BLADE) ×2 IMPLANT
NDL HYPO 25X1 1.5 SAFETY (NEEDLE) ×1 IMPLANT
NEEDLE HYPO 25X1 1.5 SAFETY (NEEDLE) ×2 IMPLANT
NS IRRIG 1000ML POUR BTL (IV SOLUTION) ×2 IMPLANT
OIL CARTRIDGE MAESTRO DRILL (MISCELLANEOUS) ×2
PACK LAMINECTOMY NEURO (CUSTOM PROCEDURE TRAY) ×2 IMPLANT
PAD ARMBOARD 7.5X6 YLW CONV (MISCELLANEOUS) ×6 IMPLANT
ROD CURVED CREO 5.5X70 (Rod) ×2 IMPLANT
SCREW 6.5X5.5 30MM (Screw) ×2 IMPLANT
SPACER SUSTAIN RT TI 8X22X11 8 (Spacer) ×2 IMPLANT
SPONGE SURGIFOAM ABS GEL 100 (HEMOSTASIS) ×2 IMPLANT
SPONGE T-LAP 4X18 ~~LOC~~+RFID (SPONGE) IMPLANT
STRIP CLOSURE SKIN 1/2X4 (GAUZE/BANDAGES/DRESSINGS) ×4 IMPLANT
SUT VIC AB 0 CT1 18XCR BRD8 (SUTURE) ×2 IMPLANT
SUT VIC AB 0 CT1 8-18 (SUTURE) ×2
SUT VIC AB 2-0 CT1 18 (SUTURE) ×2 IMPLANT
SUT VIC AB 4-0 PS2 27 (SUTURE) ×2 IMPLANT
TOWEL GREEN STERILE (TOWEL DISPOSABLE) ×2 IMPLANT
TOWEL GREEN STERILE FF (TOWEL DISPOSABLE) ×2 IMPLANT
TRAY FOLEY MTR SLVR 16FR STAT (SET/KITS/TRAYS/PACK) ×2 IMPLANT
TULIP CREP AMP 5.5MM (Orthopedic Implant) ×2 IMPLANT
WATER STERILE IRR 1000ML POUR (IV SOLUTION) ×2 IMPLANT

## 2021-08-24 NOTE — Anesthesia Postprocedure Evaluation (Signed)
Anesthesia Post Note  Patient: Ladon Vandenberghe Seidman  Procedure(s) Performed: PLIF - L5-S1 (Back)     Patient location during evaluation: PACU Anesthesia Type: General Level of consciousness: awake and alert Pain management: pain level controlled Vital Signs Assessment: post-procedure vital signs reviewed and stable Respiratory status: spontaneous breathing, nonlabored ventilation, respiratory function stable and patient connected to nasal cannula oxygen Cardiovascular status: blood pressure returned to baseline and stable Postop Assessment: no apparent nausea or vomiting Anesthetic complications: no   No notable events documented.  Last Vitals:  Vitals:   08/24/21 1512 08/24/21 1727  BP: 137/83 (!) 145/86  Pulse: 76 95  Resp: 20 18  Temp:  36.7 C  SpO2: 100% 98%    Last Pain:  Vitals:   08/24/21 1727  TempSrc: Oral  PainSc:                  Jonmarc Bodkin L Alayna Mabe

## 2021-08-24 NOTE — Op Note (Signed)
Preoperative diagnosis: Recurrent herniated nucleus pulposus L5-S1 lumbar spinal stenosis L5-S1 and degenerative disc disease with instability L5-S1  Postoperative diagnosis: Same  Procedure: Redo decompressive lumbar laminectomy L5-S1 with complete medial facetectomies and radical foraminotomies of the L5 and S1 nerve roots in excess and requiring more work than would be needed with a standard interbody fusion  2.  Posterior lumbar interbody fusion L5-S1 utilizing the globus titanium insert and rotate cages packed with locally harvested autograft mixed with vivigen and Protios  #3 exploration fusion move of hardware L4-5 placement of bilateral S1 cortical screws and cortical screw fixation with a Creo modular cortical screw set with the non-threaded locking caps  Surgeon: Dominica Severin Marsha Gundlach  Assistant: Nash Shearer  Anesthesia: General  EBL: Minimal  HPI: 55 year old gentleman previously undergone 4 5 fusion 2 years ago ruptured disc of the left below that the year after underwent microdiscectomy and did well for about 6 to 8 months has been experiencing progressive worsening back and left leg pain.  Work-up revealed a large recurrent disc herniation at L5-S1 due to the fact he had previous laminotomy and lumbar fusion L4-5 with progressive degeneration of recommended expiration fusion removal of hardware redo decompressive laminectomy and interbody fusion at L5 S1.  I extensively went over the risks and benefits of that operation with him as well as perioperative course expectations of outcome and alternatives of surgery and he understood and agreed to proceed forward.  Operative procedure: Patient was brought into the OR was Duson general anesthesia positioned prone Wilson frame his back was prepped and draped in routine sterile fashion.  Preoperative x-ray localized the appropriate level so after infiltration of 10 cc lidocaine with epi midline incision was made and Bovie electrocautery was used to  take down the subcutaneous tissue and subperiosteal dissection was carried out lamina of L5-S1 bilaterally exposing injury scar tissue exposing the hardware at L4-5.  I then remove the spinous process of L5 performed's complete central decompression and complete medial facetectomy on the patient's right side working through the scar tissue freed up all the scar tissue and performed a complete facetectomy on the left side performed radical foraminotomies of the L5 and S1 nerve roots aggressive under biting of the superior tickling facet to gain access to the lateral margin of disc base.  There is extensive scar tissue as well as a large recurrent disc still partially contained with scar ligament on the left at L5-S1 after adequate decompression been achieved the space was entered and sized recurrent disc was removed I freed up all the scar tissue to mobilize the dura and the S1 nerve root on the left work and sequentially on both sides of the disc base and sequential distraction allowed preparation of the endplates complete discectomy and placement of bilateral titanium cages.  Packed an extensive mount of autograft mix within the cages and centrally between the cages.  I then placed 2 cortical screws at S1 both screws had excellent purchase this was all done under fluoroscopy.  Then disconnected the old hardware remove the knots remove the rods put a longer rod and connected everything up and compressed L5-S1.  Then the wound was copes irrigated to Kassim states was maintained I reinspected the foramina to confirm patency no migration of graft material lay Gelfoam top of the dura placed a medium Hemovac drain and injected Exparel in the fascia.  I then closed the wound in layers with active Vicryl and a running 4 subcuticular.  Dermabond benzoin Steri-Strips and a  sterile dressing was applied patient recovery in stable condition.  At the end the case all needle count sponge counts were correct.

## 2021-08-24 NOTE — H&P (Signed)
Zakhi Dupre Bidinger is an 55 y.o. male.   Chief Complaint: Back and left greater than right leg pain HPI: 55 year old gentleman previous history of L4-5 fusion previous left-sided laminotomy at 5 1 presents with recurrent back and left leg pain work-up revealed progressive degeneration spondylosis and stenosis and possible recurrent disc radiation L5-S1.  Due to patient failed conservative treatment imaging findings and progression of clinical syndrome I recommended extending his fusion down to L5-S1 I extensively gone over the risks and benefits of that operation with him as well as perioperative course expectations of outcome and alternatives to surgery and he understands and agrees to proceed forward.  Past Medical History:  Diagnosis Date   Allergy    Anemia    Blood dyscrasia    hemachromatosis   DDD (degenerative disc disease), cervical    DDD (degenerative disc disease), lumbar    Depression    Diabetes mellitus without complication (HCC)    type 2   Fibromyalgia    Folliculitis    Headache    migraines   Hemochromatosis    followed by Dr Nita Sickle   Hyperlipidemia    Neuromuscular disorder ([place])    occasional neuropathy in left foot   Sleep apnea    past hx- has lost 45lbs- no longer needs CPAP    Past Surgical History:  Procedure Laterality Date   BACK SURGERY  2020   w/ Dr. Saintclair Halsted   broken nose surgery     COLONOSCOPY     LUMBAR LAMINECTOMY/DECOMPRESSION MICRODISCECTOMY Left 09/08/2020   Procedure: Microdiscectomy - left - Lumbar five-Sacral one;  Surgeon: Kary Kos, MD;  Location: [facility];  Service: Neurosurgery;  Laterality: Left;   NASAL SEPTUM SURGERY     UPPER GI ENDOSCOPY     WISDOM TOOTH EXTRACTION      Family History  Problem Relation Age of Onset   Hypertension Mother    Diabetes Mother    Hyperlipidemia Mother    Hyperlipidemia Father    Hypertension Father    Colon cancer Neg Hx    Colon polyps Neg Hx    Esophageal cancer Neg Hx    Rectal cancer Neg Hx     Stomach cancer Neg Hx    Social History:  reports that he has never smoked. His smokeless tobacco use includes snuff. He reports current alcohol use. He reports that he does not use drugs.  Allergies: No Known Allergies  Medications Prior to Admission  Medication Sig Dispense Refill   acetaminophen (TYLENOL) 500 MG tablet Take 1,000 mg by mouth every 6 (six) hours as needed for moderate pain.      atorvastatin (LIPITOR) 20 MG tablet Take 20 mg by mouth daily.      baclofen (LIORESAL) 10 MG tablet Take 1 tablet (10 mg total) by mouth 2 (two) times daily. (Patient taking differently: Take 10-20 mg by mouth 2 (two) times daily as needed for muscle spasms.) 60 tablet 1   Cholecalciferol (VITAMIN D3) 25 MCG (1000 UT) CAPS Take 1,000 Units by mouth daily.      clindamycin (CLEOCIN T) 1 % external solution Apply 1 application topically daily as needed (scalp condition).     doxycycline (VIBRAMYCIN) 50 MG capsule Take 50 mg by mouth daily.     fluticasone (FLONASE) 50 MCG/ACT nasal spray Place 2 sprays into both nostrils daily as needed for allergies.      levocetirizine (XYZAL) 5 MG tablet Take 5 mg by mouth daily as needed for allergies.  metFORMIN (GLUCOPHAGE-XR) 500 MG 24 hr tablet Take 1,000 mg by mouth 2 (two) times daily with a meal.     nortriptyline (PAMELOR) 25 MG capsule Take 25-50 mg by mouth at bedtime as needed for sleep.      Omega-3 Fatty Acids (FISH OIL) 1200 MG CAPS Take 1,200 mg by mouth 3 (three) times daily with meals.     pregabalin (LYRICA) 75 MG capsule Take 75-150 mg by mouth See admin instructions. Take 75 mg by mouth at lunch and 150 mg at bedtime     SUMAtriptan (IMITREX) 100 MG tablet Take 50-100 mg by mouth every 2 (two) hours as needed for migraine.     oxyCODONE-acetaminophen (PERCOCET) 5-325 MG tablet Take 1 tablet by mouth every 4 (four) hours as needed for severe pain. (Patient not taking: No sig reported) 20 tablet 0    Results for orders placed or  performed during the hospital encounter of 08/24/21 (from the past 48 hour(s))  Glucose, capillary     Status: None   Collection Time: 08/24/21  8:23 AM  Result Value Ref Range   Glucose-Capillary 86 70 - 99 mg/dL    Comment: Glucose reference range applies only to samples taken after fasting for at least 8 hours.   No results found.  Review of Systems  Musculoskeletal:  Positive for back pain.  Neurological:  Positive for weakness and numbness.   Blood pressure (!) 160/78, pulse 72, temperature 99.2 F (37.3 C), temperature source Oral, resp. rate 18, height 5\' 7"  (1.702 m), weight 65.8 kg, SpO2 100 %. Physical Exam HENT:     Head: Normocephalic.     Right Ear: Tympanic membrane normal.     Nose: Nose normal.     Mouth/Throat:     Mouth: Mucous membranes are moist.  Cardiovascular:     Rate and Rhythm: Normal rate.  Pulmonary:     Effort: Pulmonary effort is normal.  Abdominal:     General: Abdomen is flat.  Musculoskeletal:        General: Normal range of motion.  Skin:    General: Skin is warm.  Neurological:     General: No focal deficit present.     Mental Status: He is alert.     Comments: Strength is 5 out of 5 iliopsoas, quads, hamstrings, gastroc, tibialis, and EHL.     Assessment/Plan 55 year old gentleman presents for decompression fusion L5-S1  Elaina Hoops, MD 08/24/2021, 9:37 AM

## 2021-08-24 NOTE — Anesthesia Preprocedure Evaluation (Addendum)
Anesthesia Evaluation  Patient identified by MRN, date of birth, ID band Patient awake    Reviewed: Allergy & Precautions, NPO status , Patient's Chart, lab work & pertinent test results  Airway Mallampati: III  TM Distance: >3 FB Neck ROM: Full    Dental no notable dental hx. (+) Teeth Intact, Dental Advisory Given   Pulmonary sleep apnea ,    Pulmonary exam normal breath sounds clear to auscultation       Cardiovascular negative cardio ROS Normal cardiovascular exam Rhythm:Regular Rate:Normal     Neuro/Psych  Headaches, PSYCHIATRIC DISORDERS Depression    GI/Hepatic negative GI ROS, Hemachromatosis   Endo/Other  diabetes, Type 2, Oral Hypoglycemic Agents  Renal/GU negative Renal ROS  negative genitourinary   Musculoskeletal  (+) Arthritis , Fibromyalgia -  Abdominal   Peds  Hematology negative hematology ROS (+)   Anesthesia Other Findings   Reproductive/Obstetrics                           Anesthesia Physical Anesthesia Plan  ASA: 3  Anesthesia Plan: General   Post-op Pain Management:    Induction: Intravenous  PONV Risk Score and Plan: 2 and Midazolam, Dexamethasone and Ondansetron  Airway Management Planned: Oral ETT  Additional Equipment:   Intra-op Plan:   Post-operative Plan: Extubation in OR  Informed Consent: I have reviewed the patients History and Physical, chart, labs and discussed the procedure including the risks, benefits and alternatives for the proposed anesthesia with the patient or authorized representative who has indicated his/her understanding and acceptance.     Dental advisory given  Plan Discussed with: CRNA  Anesthesia Plan Comments:         Anesthesia Quick Evaluation

## 2021-08-24 NOTE — Anesthesia Procedure Notes (Signed)
Procedure Name: Intubation Date/Time: 08/24/2021 10:23 AM Performed by: Eligha Bridegroom, CRNA Pre-anesthesia Checklist: Patient identified, Emergency Drugs available, Suction available, Patient being monitored and Timeout performed Patient Re-evaluated:Patient Re-evaluated prior to induction Oxygen Delivery Method: Circle system utilized Preoxygenation: Pre-oxygenation with 100% oxygen Induction Type: IV induction Ventilation: Mask ventilation without difficulty and Oral airway inserted - appropriate to patient size Laryngoscope Size: Mac and 4 Grade View: Grade II Tube type: Oral Tube size: 7.5 mm Number of attempts: 1 Airway Equipment and Method: Stylet Placement Confirmation: ETT inserted through vocal cords under direct vision, positive ETCO2 and breath sounds checked- equal and bilateral Secured at: 22 cm Tube secured with: Tape Dental Injury: Teeth and Oropharynx as per pre-operative assessment

## 2021-08-24 NOTE — Transfer of Care (Signed)
Immediate Anesthesia Transfer of Care Note  Patient: [name]  Procedure(s) Performed: PLIF - L5-S1 (Back)  Patient Location: PACU  Anesthesia Type:General  Level of Consciousness: awake, alert  and oriented  Airway & Oxygen Therapy: Patient Spontanous Breathing and Patient connected to nasal cannula oxygen  Post-op Assessment: Report given to RN and Post -op Vital signs reviewed and stable  Post vital signs: Reviewed and stable  Last Vitals:  Vitals Value Taken Time  BP 136/70 08/24/21 1337  Temp    Pulse 84 08/24/21 1341  Resp 13 08/24/21 1341  SpO2 100 % 08/24/21 1341  Vitals shown include unvalidated device data.  Last Pain:  Vitals:   08/24/21 0826  TempSrc: Oral  PainSc:          Complications: No notable events documented.

## 2021-08-25 DIAGNOSIS — M4807 Spinal stenosis, lumbosacral region: Secondary | ICD-10-CM | POA: Diagnosis not present

## 2021-08-25 LAB — GLUCOSE, CAPILLARY: Glucose-Capillary: 101 mg/dL — ABNORMAL HIGH (ref 70–99)

## 2021-08-25 MED ORDER — OXYCODONE HCL 10 MG PO TABS: 10.0000 mg | ORAL_TABLET | ORAL | 0 refills | Status: DC | PRN

## 2021-08-25 NOTE — Progress Notes (Signed)
Patient is discharged from room 3C10 at this time. Alert and in stable condition. IV site d/c'd and instructions read to patient with understanding verbalized and all questions answered. Left unit via wheelchair with all belongings at side.  

## 2021-08-25 NOTE — Evaluation (Signed)
Physical Therapy Evaluation Patient Details Name: [name] MRN: [id number] DOB: May 06, 1966 Today's Date: 08/25/2021  History of Present Illness  55 y.o. male admitted 08/24/21 with Back and left greater than right leg pain. 10/28 underwent  Redo decompressive lumbar laminectomy L5-S1 with fusion and hardware removal L4-5. PMH-L4-5 fusion, DM, fibromyalgia, neuropathy left foot  Clinical Impression   Patient evaluated by Physical Therapy with no further acute PT needs identified. All education has been completed and the patient has no further questions. Reviewed back precaution sheet including basic ADLs as OT not ordered. Pt familiar with techniques from prior back surgery and able to demonstrate sitting crossing ankle over opposite knee for lower body dressing. PT is signing off. Thank you for this referral.        Recommendations for follow up therapy are one component of a multi-disciplinary discharge planning process, led by the attending physician.  Recommendations may be updated based on patient status, additional functional criteria and insurance authorization.  Follow Up Recommendations No PT follow up    Assistance Recommended at Discharge PRN  Functional Status Assessment Patient has not had a recent decline in their functional status  Equipment Recommendations  None recommended by PT    Recommendations for Other Services       Precautions / Restrictions Precautions Precautions: Back Precaution Booklet Issued: Yes (comment) Precaution Comments: educated on back precautions and pt familiar from prior surgery and using PTA due to pain Required Braces or Orthoses: Spinal Brace Spinal Brace: Lumbar corset;Applied in sitting position Restrictions Weight Bearing Restrictions: No      Mobility  Bed Mobility Overal bed mobility: Independent             General bed mobility comments: pt utilizes side to sit technique PTA; demonstrates independence; discussed  bending one or both knees to asssist with log rolling    Transfers Overall transfer level: Independent                      Ambulation/Gait Ambulation/Gait assistance: Independent Gait Distance (Feet): 300 Feet (pt observed walking in hall prior to PT session) Assistive device: None Gait Pattern/deviations: Step-through pattern;Decreased stride length   Gait velocity interpretation: 1.31 - 2.62 ft/sec, indicative of limited community ambulator General Gait Details: no antalgic pattern, no need for DME  Stairs            Wheelchair Mobility    Modified Rankin (Stroke Patients Only)       Balance Overall balance assessment: Independent                                           Pertinent Vitals/Pain Pain Assessment: Faces Faces Pain Scale: Hurts a little bit Pain Location: back Pain Descriptors / Indicators: Operative site guarding Pain Intervention(s): Limited activity within patient's tolerance    Home Living Family/patient expects to be discharged to:: Private residence Living Arrangements: Parent Available Help at Discharge: Family;Available 24 hours/day Type of Home: House Home Access: Stairs to enter Entrance Stairs-Rails: Right Entrance Stairs-Number of Steps: 2   Home Layout: One level Home Equipment: None      Prior Function Prior Level of Function : Independent/Modified Independent             Mobility Comments: hurting but independent with mobility ADLs Comments: independent with ADLs     Hand Dominance  Extremity/Trunk Assessment   Upper Extremity Assessment Upper Extremity Assessment: Overall WFL for tasks assessed    Lower Extremity Assessment Lower Extremity Assessment: Overall WFL for tasks assessed    Cervical / Trunk Assessment Cervical / Trunk Assessment: Back Surgery  Communication   Communication: No difficulties  Cognition Arousal/Alertness: Awake/alert Behavior During Therapy: WFL  for tasks assessed/performed Overall Cognitive Status: Within Functional Limits for tasks assessed                                          General Comments General comments (skin integrity, edema, etc.): pt inquired re: step length with walking and if takes normal stride it pulls in his hamstrning; discussed shorter step length OK for now to help control pain. Educated on use of pain scale to help guide his activity--if pain getting up to 7-10 then likely overdoing it or broke his back precautions, use pain medication, seek position of comfort (shown on handout), and monitor additional activity closely    Exercises Other Exercises Other Exercises: Educated walking is his best activity/exercise for now. See comments for gauging how much walking he should do   Assessment/Plan    PT Assessment Patient does not need any further PT services  PT Problem List         PT Treatment Interventions      PT Goals (Current goals can be found in the Care Plan section)  Acute Rehab PT Goals Patient Stated Goal: return home today PT Goal Formulation: All assessment and education complete, DC therapy    Frequency     Barriers to discharge        Co-evaluation               AM-PAC PT "6 Clicks" Mobility  Outcome Measure Help needed turning from your back to your side while in a flat bed without using bedrails?: None Help needed moving from lying on your back to sitting on the side of a flat bed without using bedrails?: None Help needed moving to and from a bed to a chair (including a wheelchair)?: None Help needed standing up from a chair using your arms (e.g., wheelchair or bedside chair)?: None Help needed to walk in hospital room?: None Help needed climbing 3-5 steps with a railing? : None 6 Click Score: 24    End of Session   Activity Tolerance: Patient tolerated treatment well Patient left: in bed;with call bell/phone within reach Nurse Communication:  Mobility status;Other (comment) (no PT or DME needs) PT Visit Diagnosis: Pain Pain - Right/Left:  (midline) Pain - part of body:  (back)    Time: 7741-2878 PT Time Calculation (min) (ACUTE ONLY): 14 min   Charges:   PT Evaluation $PT Eval Low Complexity: [place], PT Acute Rehabilitation Services  Pager [phone] Office [phone]   Rexanne Mano 08/25/2021, 9:54 AM

## 2021-08-25 NOTE — Discharge Summary (Signed)
Physician Discharge Summary  Patient ID: [name] MRN: [id number] DOB/AGE: 55-15-1967 55 y.o.  Admit date: 08/24/2021 Discharge date: 08/25/2021  Admission Diagnoses: Lumbar stenosis with instability    Discharge Diagnoses: Same   Discharged Condition: good  Hospital Course: The patient was admitted on 08/24/2021 and taken to the operating room where the patient underwent lumbar decompression and fusion. The patient tolerated the procedure well and was taken to the recovery room and then to the floor in stable condition. The hospital course was routine. There were no complications. The wound remained clean dry and intact. Pt had appropriate back soreness. No complaints of leg pain or new N/T/W. The patient remained afebrile with stable vital signs, and tolerated a regular diet. The patient continued to increase activities, and pain was well controlled with oral pain medications.   Consults: None  Significant Diagnostic Studies:  Results for orders placed or performed during the hospital encounter of 08/24/21  Glucose, capillary  Result Value Ref Range   Glucose-Capillary 86 70 - 99 mg/dL  Glucose, capillary  Result Value Ref Range   Glucose-Capillary 143 (H) 70 - 99 mg/dL  Glucose, capillary  Result Value Ref Range   Glucose-Capillary 217 (H) 70 - 99 mg/dL   Comment 1 Notify RN    Comment 2 Document in Chart   Glucose, capillary  Result Value Ref Range   Glucose-Capillary 101 (H) 70 - 99 mg/dL   Comment 1 Notify RN    Comment 2 Document in Chart     DG Lumbar Spine 2-3 Views  Result Date: 08/24/2021 CLINICAL DATA:  L5-S1 PLIF. EXAM: LUMBAR SPINE - 2-3 VIEW COMPARISON:  Lumbar spine radiographs 09/08/2020 scratched at CT of the lumbar spine without contrast 08/02/2011 FINDINGS: Previous fusion at L4-5 again noted. Pedicle screws are now in place at S1. Disc spacers are in place. Disc height restored. IMPRESSION: Interval extension of PLIF at S1 without radiographic  evidence for complication. Electronically Signed   By: San Morelle M.D.   On: 08/24/2021 14:41   CT LUMBAR SPINE WO CONTRAST  Result Date: 08/02/2021 CLINICAL DATA:  55 year old male with low back pain.  Prior surgery. EXAM: CT LUMBAR SPINE WITHOUT CONTRAST TECHNIQUE: Multidetector CT imaging of the lumbar spine was performed without intravenous contrast administration. Multiplanar CT image reconstructions were also generated. COMPARISON:  Intraoperative lumbar radiograph 09/08/2020. Lumbar MRI 06/19/2021. FINDINGS: Segmentation: Normal, the same numbering system used on priors. Alignment: Subtle anterolisthesis of L4 on L5 remains stable. Mild straightening of lumbar lordosis elsewhere. Vertebrae: Postoperative details are below. No acute osseous abnormality identified. Intact visible sacrum and SI joints. Paraspinal and other soft tissues: Left nephrolithiasis measuring up to 3 mm. Mild Calcified aortic atherosclerosis. Otherwise negative visible noncontrast abdominal viscera. Mild lumbar paraspinal postoperative soft tissue changes with no adverse features. Disc levels: T11-T12 through L3-L4 remain negative for age. L4-L5: Previous decompression and fusion. Bilateral L4 and L5 pedicle screws with no loosening or adverse features. Interbody implant with developing interbody ossification, and possible early interbody arthrodesis on coronal image 36. Also, evidence of developing posterior element arthrodesis (such as on the right sagittal images 33 and 34. No stenosis. L5-S1: Disc space loss with left eccentric disc bulging. Postoperative changes to the left lateral recess better demonstrated by MRI. Continued capacious thecal sac. Multifactorial moderate left and mild right L5 foraminal stenosis appears stable since August. IMPRESSION: 1. Previous L4-L5 decompression and fusion with evidence of developing arthrodesis and no adverse features. 2. Chronic degeneration and postoperative changes  on the left  at L5-S1. No spinal stenosis, but moderate multifactorial left L5 foraminal stenosis appears stable from the August MRI. 3. Left nephrolithiasis. 4. Mild aortic atherosclerosis. Electronically Signed   By: Genevie Ann M.D.   On: 08/02/2021 05:42   DG C-Arm 1-60 Min-No Report  Result Date: 08/24/2021 Fluoroscopy was utilized by the requesting physician.  No radiographic interpretation.    Antibiotics:  Anti-infectives (From admission, onward)    Start     Dose/Rate Route Frequency Ordered Stop   08/25/21 1000  doxycycline (VIBRAMYCIN) 50 MG capsule 50 mg        50 mg Oral Daily 08/24/21 1504     08/24/21 1600  ceFAZolin (ANCEF) IVPB 2g/100 mL premix        2 g 200 mL/hr over 30 Minutes Intravenous Every 8 hours 08/24/21 1503 08/26/21 1559   08/24/21 0815  ceFAZolin (ANCEF) IVPB 2g/100 mL premix        2 g 200 mL/hr over 30 Minutes Intravenous On call to O.R. 08/24/21 0802 08/24/21 1030   08/24/21 0807  ceFAZolin (ANCEF) 2-4 GM/100ML-% IVPB       Note to Pharmacy: Alphonsus Sias   : cabinet override      08/24/21 0807 08/24/21 2014       Discharge Exam: Blood pressure 130/65, pulse (!) 58, temperature 98 F (36.7 C), temperature source Oral, resp. rate 18, height 5\' 7"  (1.702 m), weight 65.8 kg, SpO2 100 %. Neurologic: Grossly normal Dressing dry  Discharge Medications:   Allergies as of 08/25/2021   No Known Allergies      Medication List     STOP taking these medications    oxyCODONE-acetaminophen 5-325 MG tablet Commonly known as: Percocet       TAKE these medications    acetaminophen 500 MG tablet Commonly known as: TYLENOL Take 1,000 mg by mouth every 6 (six) hours as needed for moderate pain.   atorvastatin 20 MG tablet Commonly known as: LIPITOR Take 20 mg by mouth daily.   baclofen 10 MG tablet Commonly known as: LIORESAL Take 1 tablet (10 mg total) by mouth 2 (two) times daily. What changed:  how much to take when to take this reasons to take this    clindamycin 1 % external solution Commonly known as: CLEOCIN T Apply 1 application topically daily as needed (scalp condition).   doxycycline 50 MG capsule Commonly known as: VIBRAMYCIN Take 50 mg by mouth daily.   Fish Oil 1200 MG Caps Take 1,200 mg by mouth 3 (three) times daily with meals.   fluticasone 50 MCG/ACT nasal spray Commonly known as: FLONASE Place 2 sprays into both nostrils daily as needed for allergies.   levocetirizine 5 MG tablet Commonly known as: XYZAL Take 5 mg by mouth daily as needed for allergies.   metFORMIN 500 MG 24 hr tablet Commonly known as: GLUCOPHAGE-XR Take 1,000 mg by mouth 2 (two) times daily with a meal.   nortriptyline 25 MG capsule Commonly known as: PAMELOR Take 25-50 mg by mouth at bedtime as needed for sleep.   Oxycodone HCl 10 MG Tabs Take 1 tablet (10 mg total) by mouth every 4 (four) hours as needed for severe pain ((score 7 to 10)).   pregabalin 75 MG capsule Commonly known as: LYRICA Take 75-150 mg by mouth See admin instructions. Take 75 mg by mouth at lunch and 150 mg at bedtime   SUMAtriptan 100 MG tablet Commonly known as: IMITREX Take 50-100 mg by mouth  every 2 (two) hours as needed for migraine.   Vitamin D3 25 MCG (1000 UT) Caps Take 1,000 Units by mouth daily.        Disposition: home   Final Dx: lumbar fusion  Discharge Instructions      Remove dressing in 72 hours   Complete by: As directed    Call MD for:  difficulty breathing, headache or visual disturbances   Complete by: As directed    Call MD for:  persistant nausea and vomiting   Complete by: As directed    Call MD for:  redness, tenderness, or signs of infection (pain, swelling, redness, odor or green/yellow discharge around incision site)   Complete by: As directed    Call MD for:  severe uncontrolled pain   Complete by: As directed    Call MD for:  temperature >100.4   Complete by: As directed    Diet - low sodium heart healthy   Complete  by: As directed    Increase activity slowly   Complete by: As directed           Signed: Eustace Moore 08/25/2021, 8:16 AM

## 2021-09-07 ENCOUNTER — Telehealth: Payer: Self-pay | Admitting: Internal Medicine

## 2021-09-07 ENCOUNTER — Other Ambulatory Visit: Payer: Self-pay

## 2021-09-07 NOTE — Telephone Encounter (Signed)
Pt made aware of the change in his phlebotomy care.  Referral has been placed. Pt made aware.  Pt verbalized understanding with all questions answered.

## 2021-09-07 NOTE — Telephone Encounter (Signed)
Need to contact patient and explain that our lab will not do therapeutic phlebotomy anymore.  Thus I am unable to continue to provide his hemochromatosis care.  He needs a referral to hematology for this.  It is not urgent.

## 2021-09-10 ENCOUNTER — Telehealth: Payer: Self-pay | Admitting: Hematology and Oncology

## 2021-09-10 NOTE — Telephone Encounter (Signed)
Scheduled appt per 11/11 referral. Pt is aware of appt date and time.  

## 2021-09-28 ENCOUNTER — Inpatient Hospital Stay: Payer: BC Managed Care – PPO | Attending: Hematology and Oncology | Admitting: Hematology and Oncology

## 2021-09-28 ENCOUNTER — Other Ambulatory Visit: Payer: Self-pay

## 2021-09-28 ENCOUNTER — Inpatient Hospital Stay: Payer: BC Managed Care – PPO

## 2021-09-28 DIAGNOSIS — Z79899 Other long term (current) drug therapy: Secondary | ICD-10-CM | POA: Diagnosis not present

## 2021-09-28 DIAGNOSIS — Z7984 Long term (current) use of oral hypoglycemic drugs: Secondary | ICD-10-CM | POA: Insufficient documentation

## 2021-09-28 DIAGNOSIS — E119 Type 2 diabetes mellitus without complications: Secondary | ICD-10-CM | POA: Diagnosis not present

## 2021-09-28 LAB — CMP (CANCER CENTER ONLY)
ALT: 22 U/L (ref 0–44)
AST: 21 U/L (ref 15–41)
Albumin: 4.5 g/dL (ref 3.5–5.0)
Alkaline Phosphatase: 95 U/L (ref 38–126)
Anion gap: 11 (ref 5–15)
BUN: 13 mg/dL (ref 6–20)
CO2: 28 mmol/L (ref 22–32)
Calcium: 9.9 mg/dL (ref 8.9–10.3)
Chloride: 102 mmol/L (ref 98–111)
Creatinine: 1.01 mg/dL (ref 0.61–1.24)
GFR, Estimated: >60 mL/min (ref >60)
Glucose, Bld: 175 mg/dL — ABNORMAL HIGH (ref 70–99)
Potassium: 5 mmol/L (ref 3.5–5.1)
Sodium: 141 mmol/L (ref 135–145)
Total Bilirubin: 0.7 mg/dL (ref 0.3–1.2)
Total Protein: 7.3 g/dL (ref 6.5–8.1)

## 2021-09-28 LAB — IRON AND TIBC
Iron: 76 ug/dL (ref 42–163)
Saturation Ratios: 22 % (ref 20–55)
TIBC: 346 ug/dL (ref 202–409)
UIBC: 271 ug/dL (ref 117–376)

## 2021-09-28 LAB — CBC WITH DIFFERENTIAL (CANCER CENTER ONLY)
Abs Immature Granulocytes: 0.02 10*3/uL (ref 0.00–0.07)
Basophils Absolute: 0.1 10*3/uL (ref 0.0–0.1)
Basophils Relative: 1 %
Eosinophils Absolute: 0.2 10*3/uL (ref 0.0–0.5)
Eosinophils Relative: 4 %
HCT: 36.7 % — ABNORMAL LOW (ref 39.0–52.0)
Hemoglobin: 12.8 g/dL — ABNORMAL LOW (ref 13.0–17.0)
Immature Granulocytes: 0 %
Lymphocytes Relative: 19 %
Lymphs Abs: 0.8 10*3/uL (ref 0.7–4.0)
MCH: 32.3 pg (ref 26.0–34.0)
MCHC: 34.9 g/dL (ref 30.0–36.0)
MCV: 92.7 fL (ref 80.0–100.0)
Monocytes Absolute: 0.4 10*3/uL (ref 0.1–1.0)
Monocytes Relative: 10 %
Neutro Abs: 3 10*3/uL (ref 1.7–7.7)
Neutrophils Relative %: 66 %
Platelet Count: 208 10*3/uL (ref 150–400)
RBC: 3.96 MIL/uL — ABNORMAL LOW (ref 4.22–5.81)
RDW: 12.6 % (ref 11.5–15.5)
WBC Count: 4.5 10*3/uL (ref 4.0–10.5)
nRBC: 0 % (ref 0.0–0.2)

## 2021-09-28 LAB — FERRITIN: Ferritin: 12 ng/mL — ABNORMAL LOW (ref 24–336)

## 2021-09-28 NOTE — Progress Notes (Signed)
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 Lorenso Courier   CHIEF COMPLAINTS/PURPOSE OF CONSULTATION:  "Hemochromatosis "  HISTORY OF PRESENTING ILLNESS:  Gearl Baratta Grieger 55 y.o. male with medical history significant for DM type II, fibromyalgia, OSA, and degenerative disc disease who presents for evaluation of hemochromatosis.   On review of the previous records Mr. Tolan was followed by [facility] for his hemochromatosis.  He has maintained consistently low ferritins since at least 2012.  Most recently on 03/21/2019 the patient was noted to have a ferritin of 11.2.  He has no evidence of endorgan damage due to iron overload.  Due to concern that phlebotomies cannot be performed at GI any longer the patient was transferred to hematology for further evaluation and management.  On exam today Mr. Gathright reports he has been receiving care for his rotatory hemochromatosis for approximately 12 years.  He has been predominantly working with Dr. Arelia Longest on this.  He notes that he tolerates the phlebotomies well but they do cause him some trouble in the summer months.  He notes that on occasionally hot days during the summer he "dies from lack of energy".  He notes he does not have any other overt signs of bleeding such as nosebleeds, gum bleeding, or dark stools.  He does not follow any particular restrictive diets although he tries to eat well due to his diabetes.  On further discussion he notes that he has a sister with 3 children.  He has a paternal uncle and paternal grandfather who both passed away of stomach cancer.  Type 2 diabetes  runs on his father side of the family and the patient has this as well.  He notes that he is a never smoker but does currently uses snuff.  He only rarely drinks alcohol.  He currently works as a Chief Technology Officer.  He is frequently undergoing back surgeries and notes he went through a spinal fusion approximately 5 weeks ago.  He currently denies any fevers, chills, sweats, nausea, vomiting or diarrhea.  Full 10 point ROS is listed below.  MEDICAL HISTORY:  Past Medical History:  Diagnosis Date   Allergy    Anemia    Blood dyscrasia    hemachromatosis   DDD (degenerative disc disease), cervical    DDD (degenerative disc disease), lumbar    Depression    Diabetes mellitus without complication (HCC)    type 2   Fibromyalgia    Folliculitis    Headache    migraines   Hemochromatosis    followed by Dr Nita Sickle   Hyperlipidemia    Neuromuscular disorder ([place])    occasional neuropathy in left foot   Sleep apnea    past hx- has lost 45lbs- no longer needs CPAP    SURGICAL HISTORY: Past Surgical History:  Procedure Laterality Date   BACK SURGERY  2020   w/ Dr. Saintclair Halsted   broken nose surgery     COLONOSCOPY     LUMBAR LAMINECTOMY/DECOMPRESSION MICRODISCECTOMY Left 09/08/2020   Procedure: Microdiscectomy - left - Lumbar five-Sacral one;  Surgeon: Kary Kos, MD;  Location: [place];  Service: Neurosurgery;  Laterality: Left;   NASAL SEPTUM SURGERY     UPPER GI  ENDOSCOPY     WISDOM TOOTH EXTRACTION      SOCIAL HISTORY: Social History   Socioeconomic History   Marital status: Single    Spouse name: Not on file   Number of children: Not on file   Years of education: Not on file   Highest education level: Not on file  Occupational History   Occupation: auto Designer, jewellery  Tobacco Use   Smoking status: Never   Smokeless tobacco: Current    Types: Snuff  Vaping Use   Vaping Use: Never used  Substance and Sexual Activity   Alcohol use: Yes    Comment: socially [employer]   Drug  use: Never   Sexual activity: Not on file  Other Topics Concern   Not on file  Social History Narrative   Not on file   Social Determinants of Health   Financial Resource Strain: Not on file  Food Insecurity: Not on file  Transportation Needs: Not on file  Physical Activity: Not on file  Stress: Not on file  Social Connections: Not on file  Intimate Partner Violence: Not on file    FAMILY HISTORY: Family History  Problem Relation Age of Onset   Hypertension Mother    Diabetes Mother    Hyperlipidemia Mother    Hyperlipidemia Father    Hypertension Father    Colon cancer Neg Hx    Colon polyps Neg Hx    Esophageal cancer Neg Hx    Rectal cancer Neg Hx    Stomach cancer Neg Hx     ALLERGIES:  has No Known Allergies.  MEDICATIONS:  Current Outpatient Medications  Medication Sig Dispense Refill   acetaminophen (TYLENOL) 500 MG tablet Take 1,000 mg by mouth every 6 (six) hours as needed for moderate pain.      atorvastatin (LIPITOR) 20 MG tablet Take 20 mg by mouth daily.      baclofen (LIORESAL) 10 MG tablet Take 10-20 mg by mouth at bedtime as needed.     Cholecalciferol (VITAMIN D3) 25 MCG (1000 UT) CAPS Take 1,000 Units by mouth daily.      clindamycin (CLEOCIN T) 1 % external solution Apply 1 application topically daily as needed (scalp condition).     doxycycline (VIBRAMYCIN) 50 MG capsule Take 50 mg by mouth daily.     fluticasone (FLONASE) 50 MCG/ACT nasal spray Place 2 sprays into both nostrils daily as needed for allergies.      levocetirizine (XYZAL) 5 MG tablet Take 5 mg by mouth daily as needed for allergies.      metFORMIN (GLUCOPHAGE-XR) 500 MG 24 hr tablet Take 1,000 mg by mouth 2 (two) times daily with a meal.     nortriptyline (PAMELOR) 25 MG capsule Take 25-50 mg by mouth at bedtime as needed for sleep.      Omega-3 Fatty Acids (FISH OIL) 1200 MG CAPS Take 1,200 mg by mouth 3 (three) times daily with meals.     oxyCODONE 10 MG TABS Take 1 tablet (10 mg  total) by mouth every 4 (four) hours as needed for severe pain ((score 7 to 10)). 40 tablet 0   pregabalin (LYRICA) 75 MG capsule Take 75-150 mg by mouth See admin instructions. Take 75 mg by mouth at lunch and 150 mg at bedtime     SUMAtriptan (IMITREX) 100 MG tablet Take 50-100 mg by mouth every 2 (two) hours as needed for migraine.     No current facility-administered medications for this visit.    REVIEW  OF SYSTEMS:   Constitutional: ( - ) fevers, ( - )  chills , ( - ) night sweats Eyes: ( - ) blurriness of vision, ( - ) double vision, ( - ) watery eyes Ears, nose, mouth, throat, and face: ( - ) mucositis, ( - ) sore throat Respiratory: ( - ) cough, ( - ) dyspnea, ( - ) wheezes Cardiovascular: ( - ) palpitation, ( - ) chest discomfort, ( - ) lower extremity swelling Gastrointestinal:  ( - ) nausea, ( - ) heartburn, ( - ) change in bowel habits Skin: ( - ) abnormal skin rashes Lymphatics: ( - ) new lymphadenopathy, ( - ) easy bruising Neurological: ( - ) numbness, ( - ) tingling, ( - ) new weaknesses Behavioral/Psych: ( - ) mood change, ( - ) new changes  All other systems were reviewed with the patient and are negative.  PHYSICAL EXAMINATION: ECOG PERFORMANCE STATUS: 0 - Asymptomatic  Vitals:   09/28/21 1237  Pulse: 90  Resp: 17  Temp: 97.6 F (36.4 C)  SpO2: 100%   Filed Weights   09/28/21 1237  Weight: 153 lb 2 oz (69.5 kg)    GENERAL: well appearing middle-aged Caucasian male in NAD  SKIN: skin color, texture, turgor are normal, no rashes or significant lesions EYES: conjunctiva are pink and non-injected, sclera clear LUNGS: clear to auscultation and percussion with normal breathing effort HEART: regular rate & rhythm and no murmurs and no lower extremity edema Musculoskeletal: no cyanosis of digits and no clubbing  PSYCH: alert & oriented x 3, fluent speech NEURO: no focal motor/sensory deficits  LABORATORY DATA:  I have reviewed the data as listed CBC Latest  Ref Rng & Units 09/28/2021 08/21/2021 03/20/2021  WBC 4.0 - 10.5 K/uL 4.5 5.1 4.9  Hemoglobin 13.0 - 17.0 g/dL 12.8(L) 13.8 14.1  Hematocrit 39.0 - 52.0 % 36.7(L) 40.9 40.6  Platelets 150 - 400 K/uL 208 202 218.0    CMP Latest Ref Rng & Units 09/28/2021 08/21/2021 09/05/2020  Glucose 70 - 99 mg/dL 175(H) 163(H) 141(H)  BUN 6 - 20 mg/dL 13 9 12   Creatinine 0.61 - 1.24 mg/dL 1.01 0.87 0.86  Sodium 135 - 145 mmol/L 141 138 138  Potassium 3.5 - 5.1 mmol/L 5.0 4.3 4.4  Chloride 98 - 111 mmol/L 102 99 99  CO2 22 - 32 mmol/L 28 32 27  Calcium 8.9 - 10.3 mg/dL 9.9 9.7 9.8  Total Protein 6.5 - 8.1 g/dL 7.3 - -  Total Bilirubin 0.3 - 1.2 mg/dL 0.7 - -  Alkaline Phos 38 - 126 U/L 95 - -  AST 15 - 41 U/L 21 - -  ALT 0 - 44 U/L 22 - -    RADIOGRAPHIC STUDIES: No results found.  ASSESSMENT & PLAN Bronco Mcgrory Mabey 55 y.o. male with medical history significant for DM type II, fibromyalgia, OSA, and degenerative disc disease who presents for evaluation of hemochromatosis.   After review of the labs, review of the records, and discussion with the patient the patients findings are most consistent with a compound heterozygote hemochromatosis.  Typically these patients have more benign clinical courses and rarely suffer from iron overload.  Therefore I do not think we need a particular restrictive goal for his ferritin.  Patient has had a consistently low ferritin since at least 2012.  I would recommend that we try to keep his levels down from extreme, but elevations into the normal range would not be unreasonable at this time.  The patient voices understanding of this plan moving forward.  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  Last phlebotomy performed in May 2022 --We will check CBC, CMP, iron panel, and ferritin at each  visit. --Have patient return to clinic in 3 months for labs only and in 6 months for return clinic visit  Orders Placed This Encounter  Procedures   CBC with Differential ([place] Only)    Standing Status:   Future    Number of Occurrences:   1    Standing Expiration Date:   09/28/2022   CMP ([place] only)    Standing Status:   Future    Number of Occurrences:   1    Standing Expiration Date:   09/28/2022   Ferritin    Standing Status:   Future    Number of Occurrences:   1    Standing Expiration Date:   09/28/2022   Iron and TIBC    Standing Status:   Future    Number of Occurrences:   1    Standing Expiration Date:   09/28/2022    All questions were answered. The patient knows to call the clinic with any problems, questions or concerns.  A total of more than 60 minutes were spent on this encounter with face-to-face time and non-face-to-face time, including preparing to see the patient, ordering tests and/or medications, counseling the patient and coordination of care as outlined above.   Ledell Peoples, MD Department of Hematology/Oncology [place] [facility] Phone: [phone] Pager: [phone] Email: [email or web address]  09/28/2021 4:26 PM  [place] Lisabeth Pick NA, [place] GW, [place], [place] CC, [place] CE, [place] DR, [place] DM, [place] MB, [place] [facility], [place] MC, [place], [place] GG, [place], [place] JK, [place], Zenia Resides KJ; [employer]. HFE C282Y/H63D compound heterozygotes are at low risk of hemochromatosis-related morbidity. Hepatology. 2009 Jul;50(1):94-101.  --. Although compound heterozygotes might maintain elevated iron indices during middle age, documented iron overload-related disease is rare.

## 2021-11-06 IMAGING — CR DG LUMBAR SPINE 1V
1 series · 1 of 1 positions shown · non-contrast
Comparison: PA and lateral views of the lumbar spine 08/22/2020.

CLINICAL DATA: Intraoperative imaging for localization patient
undergoing L5-S1 micro discectomy.

EXAM:
LUMBAR SPINE - 1 VIEW

[lateral]
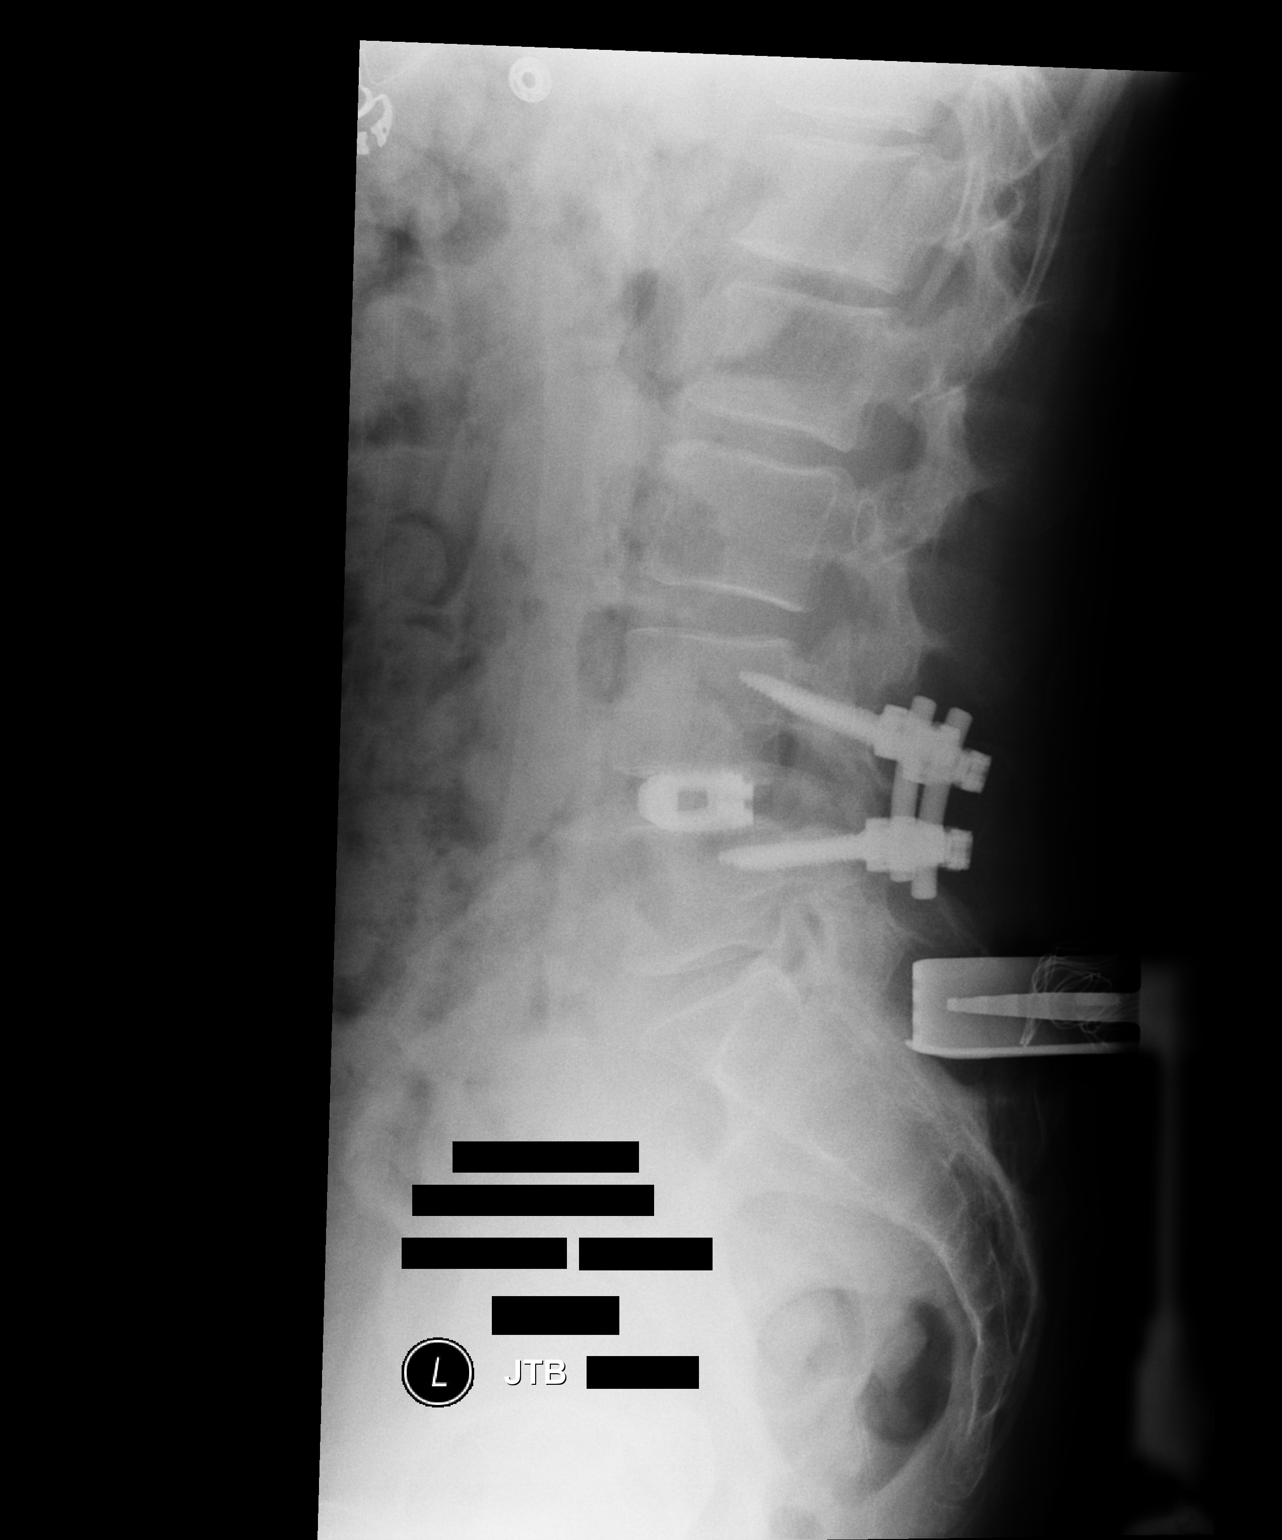

[1 of 1 positions shown; findings below may reference images not displayed]

FINDINGS: Single lateral view of the lumbar spine demonstrates a probe
directed toward the S1 vertebral body. Clamp is at the level of S1
in appears to be on the inferior margin of the L5 spinous process.
IMPRESSION: Localization as above.

## 2021-12-28 ENCOUNTER — Other Ambulatory Visit: Payer: Self-pay

## 2021-12-28 ENCOUNTER — Inpatient Hospital Stay: Payer: BC Managed Care – PPO | Attending: Hematology and Oncology

## 2021-12-28 LAB — CMP (CANCER CENTER ONLY)
ALT: 18 U/L (ref 0–44)
AST: 19 U/L (ref 15–41)
Albumin: 4.6 g/dL (ref 3.5–5.0)
Alkaline Phosphatase: 72 U/L (ref 38–126)
Anion gap: 7 (ref 5–15)
BUN: 15 mg/dL (ref 6–20)
CO2: 30 mmol/L (ref 22–32)
Calcium: 9.8 mg/dL (ref 8.9–10.3)
Chloride: 102 mmol/L (ref 98–111)
Creatinine: 0.93 mg/dL (ref 0.61–1.24)
GFR, Estimated: >60 mL/min (ref >60)
Glucose, Bld: 183 mg/dL — ABNORMAL HIGH (ref 70–99)
Potassium: 4.3 mmol/L (ref 3.5–5.1)
Sodium: 139 mmol/L (ref 135–145)
Total Bilirubin: 0.5 mg/dL (ref 0.3–1.2)
Total Protein: 7 g/dL (ref 6.5–8.1)

## 2021-12-28 LAB — CBC WITH DIFFERENTIAL (CANCER CENTER ONLY)
Abs Immature Granulocytes: 0.02 10*3/uL (ref 0.00–0.07)
Basophils Absolute: 0 10*3/uL (ref 0.0–0.1)
Basophils Relative: 1 %
Eosinophils Absolute: 0.3 10*3/uL (ref 0.0–0.5)
Eosinophils Relative: 5 %
HCT: 38.5 % — ABNORMAL LOW (ref 39.0–52.0)
Hemoglobin: 13.2 g/dL (ref 13.0–17.0)
Immature Granulocytes: 0 %
Lymphocytes Relative: 24 %
Lymphs Abs: 1.2 10*3/uL (ref 0.7–4.0)
MCH: 30.8 pg (ref 26.0–34.0)
MCHC: 34.3 g/dL (ref 30.0–36.0)
MCV: 90 fL (ref 80.0–100.0)
Monocytes Absolute: 0.4 10*3/uL (ref 0.1–1.0)
Monocytes Relative: 8 %
Neutro Abs: 3.2 10*3/uL (ref 1.7–7.7)
Neutrophils Relative %: 62 %
Platelet Count: 237 10*3/uL (ref 150–400)
RBC: 4.28 MIL/uL (ref 4.22–5.81)
RDW: 12.8 % (ref 11.5–15.5)
WBC Count: 5.2 10*3/uL (ref 4.0–10.5)
nRBC: 0 % (ref 0.0–0.2)

## 2021-12-28 LAB — IRON AND IRON BINDING CAPACITY (CC-WL,HP ONLY)
Iron: 64 ug/dL (ref 45–182)
Saturation Ratios: 17 % — ABNORMAL LOW (ref 17.9–39.5)
TIBC: 382 ug/dL (ref 250–450)
UIBC: 318 ug/dL (ref 117–376)

## 2021-12-28 LAB — FERRITIN: Ferritin: 9 ng/mL — ABNORMAL LOW (ref 24–336)

## 2022-02-26 ENCOUNTER — Ambulatory Visit: Payer: BC Managed Care – PPO | Admitting: Internal Medicine

## 2022-02-26 ENCOUNTER — Encounter: Payer: Self-pay | Admitting: Internal Medicine

## 2022-02-26 VITALS — BP 160/68 | HR 84 | Ht 67.0 in | Wt 152.0 lb

## 2022-02-26 DIAGNOSIS — R195 Other fecal abnormalities: Secondary | ICD-10-CM | POA: Diagnosis not present

## 2022-02-26 DIAGNOSIS — D509 Iron deficiency anemia, unspecified: Secondary | ICD-10-CM | POA: Diagnosis not present

## 2022-02-26 MED ORDER — PLENVU 140 G PO SOLR: 1.0000 | ORAL | 0 refills | Status: DC

## 2022-02-26 NOTE — Patient Instructions (Signed)
You have been scheduled for an endoscopy and colonoscopy. Please follow the written instructions given to you at your visit today. ?Please use the Plenvu sample you have been given today. ?If you use inhalers (even only as needed), please bring them with you on the day of your procedure. ? ?Due to recent changes in healthcare laws, you may see the results of your imaging and laboratory studies on MyChart before your provider has had a chance to review them.  We understand that in some cases there may be results that are confusing or concerning to you. Not all laboratory results come back in the same time frame and the provider may be waiting for multiple results in order to interpret others.  Please give [place] 48 hours in order for your provider to thoroughly review all the results before contacting the office for clarification of your results.  ? ? ? ?I appreciate the opportunity to care for you. ?Silvano Rusk, MD, [facility] ? ? ? ?

## 2022-02-26 NOTE — Progress Notes (Signed)
? [name] y.o. [birth year] [id number] ? ?Assessment & Plan:  ? ?Encounter Diagnoses  ?Name Primary?  ? Heme + stool Yes  ? Iron deficiency anemia, unspecified iron deficiency anemia type   ? Other hemochromatosis   ? ?His iron deficiency has persisted longer than 1 would have expected.  It does raise the question of a blood loss issue.  I do not think it is nutritional.  Phlebotomy is over at this point so EGD and colonoscopy are reasonable to repeat.  Should they be negative I would consider capsule endoscopy of the small bowel. ? ?The risks and benefits as well as alternatives of endoscopic procedure(s) have been discussed and reviewed. All questions answered. The patient agrees to proceed. ? ?We will have him take 4 Dulcolax and drink 2 doses of MiraLAX the day before prep day to help with the colonoscopy prep. ? ?I appreciate the opportunity to care for this patient. ?CC: Derinda Late, MD ? ? ? ? ? ?Subjective:  ? ?Chief Complaint: Iron deficiency anemia ? ?HPI ?The patient is a 56 year old white man known to me with a history of compound heterozygous hemochromatosis situation who had been on phlebotomy for a number of years but over the past several years his ferritin has been persistently low so phlebotomy was stopped.  He recently transition to the care of Dr. Lorenso Courier of hematology.  I have reviewed that note.  In 2020 he had iron deficiency and because of the persistence after negative screening colonoscopy had him do an EGD.  Both of this test were negative though the EGD and did have some retained food.  It was done in a separate day.  He is sent back today because Dr. Sandi Mariscal assessed his hemoglobin and iron levels and they remain low and that his saturation is 17% TIBC 382 and ferritin was 9 in March 2023.  This was ordered by Dr. Lorenso Courier.  Hemoglobin is normal at 13.2.  Renal function is normal also. ? ?Dr. Sandi Mariscal had him do Hemoccults on 3 different occasions and I have at least 2  of those sets in 2 of the 3 Hemoccults performed on February 14 were positive.  The ones on November 28, 2018 negative.  Patient has a little bit of fatigue at times.  He is getting a lumbar spine surgery.  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  He has not seen any melena or active GI bleeding.  He does eat red meat and iron-containing foods.  In December his hemoglobin was trace low at 12.8.  MCV is 90. ? ?Patient does have some chronic constipation does colonoscopy prep was good the last time he said it started late. ?Allergies  ?Allergen Reactions  ? Doxepin Hcl Other (See Comments)  ?  Weight gain  ? Naproxen Other (See Comments)  ?  stomatitis  ? ?Current Meds  ?Medication Sig  ? acetaminophen (TYLENOL) 500 MG tablet Take 1,000 mg by mouth every 6 (six) hours as needed for moderate pain.   ? atorvastatin (LIPITOR) 20 MG tablet Take 20 mg by mouth daily.   ? baclofen (LIORESAL) 10 MG tablet Take 10-20 mg by mouth at bedtime as needed.  ? Cholecalciferol (VITAMIN D3) 25 MCG (1000 UT) CAPS Take 1,000 Units by mouth daily.   ? clindamycin (CLEOCIN T) 1 % external solution Apply 1 application topically daily as needed (scalp condition).  ? doxycycline (VIBRAMYCIN) 50 MG capsule Take  50 mg by mouth daily.  ? fluticasone (FLONASE) 50 MCG/ACT nasal spray Place 2 sprays into both nostrils daily as needed for allergies.   ? levocetirizine (XYZAL) 5 MG tablet Take 5 mg by mouth daily as needed for allergies.   ? metFORMIN (GLUCOPHAGE-XR) 500 MG 24 hr tablet Take 1,000 mg by mouth 2 (two) times daily with a meal.  ? nortriptyline (PAMELOR) 25 MG capsule Take 25-50 mg by mouth at bedtime as needed for sleep.   ? Omega-3 Fatty Acids (FISH OIL) 1200 MG CAPS Take 1,200 mg by mouth 3 (three) times daily with meals.  ? PEG-KCl-NaCl-NaSulf-Na Asc-C (PLENVU) 140 g SOLR Take 1 kit by mouth as directed.  ? pregabalin (LYRICA) 75 MG capsule Take 75-150 mg by mouth See admin  instructions. Take 75 mg by mouth at lunch and 150 mg at bedtime  ? SUMAtriptan (IMITREX) 100 MG tablet Take 50-100 mg by mouth every 2 (two) hours as needed for migraine.  ? ?Past Medical History:  ?Diagnosis Date  ? Allergy   ? Anemia   ? Blood dyscrasia   ? hemachromatosis  ? DDD (degenerative disc disease), cervical   ? DDD (degenerative disc disease), lumbar   ? Depression   ? Diabetes mellitus without complication ([place])   ? type 2  ? Fibromyalgia   ? Folliculitis   ? Headache   ? migraines  ? Hemochromatosis   ? followed by Dr Nita Sickle  ? Hyperlipidemia   ? Neuromuscular disorder ([place])   ? occasional neuropathy in left foot  ? Sleep apnea   ? past hx- has lost 45lbs- no longer needs CPAP  ? ?Past Surgical History:  ?Procedure Laterality Date  ? BACK SURGERY  2020  ? w/ Dr. Saintclair Halsted  ? broken nose surgery    ? COLONOSCOPY    ? LUMBAR LAMINECTOMY  07/2021  ? LUMBAR LAMINECTOMY/DECOMPRESSION MICRODISCECTOMY Left 09/08/2020  ? Procedure: Microdiscectomy - left - Lumbar five-Sacral one;  Surgeon: Kary Kos, MD;  Location: [place];  Service: Neurosurgery;  Laterality: Left;  ? NASAL SEPTUM SURGERY    ? UPPER GI ENDOSCOPY    ? WISDOM TOOTH EXTRACTION    ? ?Social History  ? ?Social History Narrative  ? 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 ?family history includes Diabetes in his mother; Hyperlipidemia in his father and mother; Hypertension in his father and mother. ? ? ?Review of Systems ?Recovering from spine surgery - out of work otherwise negative. ? ?Objective:  ? Physical Exam ?BP (!) 160/68   Pulse 84   Ht 5' 7" (1.702 m)   Wt 152 lb (68.9 kg)   BMI 23.81 kg/m?  ?NAD ?Lungs cta ?Cor NL S1S2 no rmg ?Abd soft NT no HSM/mass ?Alert and oriented x 3 ? ? ?

## 2022-03-29 ENCOUNTER — Inpatient Hospital Stay (HOSPITAL_BASED_OUTPATIENT_CLINIC_OR_DEPARTMENT_OTHER): Payer: BC Managed Care – PPO | Admitting: Hematology and Oncology

## 2022-03-29 ENCOUNTER — Inpatient Hospital Stay: Payer: BC Managed Care – PPO | Attending: Hematology and Oncology

## 2022-03-29 ENCOUNTER — Other Ambulatory Visit: Payer: Self-pay

## 2022-03-29 LAB — CBC WITH DIFFERENTIAL (CANCER CENTER ONLY)
Abs Immature Granulocytes: 0.01 10*3/uL (ref 0.00–0.07)
Basophils Absolute: 0 10*3/uL (ref 0.0–0.1)
Basophils Relative: 1 %
Eosinophils Absolute: 0.2 10*3/uL (ref 0.0–0.5)
Eosinophils Relative: 5 %
HCT: 41.3 % (ref 39.0–52.0)
Hemoglobin: 14.3 g/dL (ref 13.0–17.0)
Immature Granulocytes: 0 %
Lymphocytes Relative: 25 %
Lymphs Abs: 1.2 10*3/uL (ref 0.7–4.0)
MCH: 32.6 pg (ref 26.0–34.0)
MCHC: 34.6 g/dL (ref 30.0–36.0)
MCV: 94.3 fL (ref 80.0–100.0)
Monocytes Absolute: 0.4 10*3/uL (ref 0.1–1.0)
Monocytes Relative: 8 %
Neutro Abs: 2.9 10*3/uL (ref 1.7–7.7)
Neutrophils Relative %: 61 %
Platelet Count: 208 10*3/uL (ref 150–400)
RBC: 4.38 MIL/uL (ref 4.22–5.81)
RDW: 13.5 % (ref 11.5–15.5)
WBC Count: 4.8 10*3/uL (ref 4.0–10.5)
nRBC: 0 % (ref 0.0–0.2)

## 2022-03-29 LAB — CMP (CANCER CENTER ONLY)
ALT: 18 U/L (ref 0–44)
AST: 21 U/L (ref 15–41)
Albumin: 4.7 g/dL (ref 3.5–5.0)
Alkaline Phosphatase: 76 U/L (ref 38–126)
Anion gap: 8 (ref 5–15)
BUN: 14 mg/dL (ref 6–20)
CO2: 31 mmol/L (ref 22–32)
Calcium: 10.3 mg/dL (ref 8.9–10.3)
Chloride: 99 mmol/L (ref 98–111)
Creatinine: 0.89 mg/dL (ref 0.61–1.24)
GFR, Estimated: >60 mL/min (ref >60)
Glucose, Bld: 221 mg/dL — ABNORMAL HIGH (ref 70–99)
Potassium: 4.6 mmol/L (ref 3.5–5.1)
Sodium: 138 mmol/L (ref 135–145)
Total Bilirubin: 0.5 mg/dL (ref 0.3–1.2)
Total Protein: 7 g/dL (ref 6.5–8.1)

## 2022-03-29 LAB — FERRITIN: Ferritin: 11 ng/mL — ABNORMAL LOW (ref 24–336)

## 2022-03-29 NOTE — Progress Notes (Signed)
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 Leonides Schanz. Phlebotomies held.    Interval History:  [name] 56 y.o. male with medical history significant for compound heterozygous hemochromatosis who presents for a follow up visit. The patient's last visit was on 09/28/2021. In the interim since the last visit he was found to have Hemoccult positive stool and was scheduled for colonoscopy and endoscopy.  On exam today [name] notes that he has been feeling good overall.  He notes that his energy levels have been "so-so".  He reports that his energy today is about an 8 out of 10 though on some days can be as low as 5 out of 10.  He is alarmed that he underwent stool samples and 3 out of the 4 of these showed no blood in his stool.  He is currently scheduled for an EGD and colonoscopy on 04/18/2022.  He reports his appetite has been good and he is not seeing any gross red blood in the stool.  He also denies any dark black stools.  He does eat red meat approximately 2-3 times per week.  He otherwise denies any issues with fevers, chills, sweats, nausea, vomiting or diarrhea.  A full 10 point ROS is listed below.  MEDICAL HISTORY:  Past Medical History:  Diagnosis Date   Allergy    Anemia    DDD (degenerative disc disease), cervical    DDD (degenerative disc disease), lumbar    Depression    Diabetes mellitus without complication (HCC)    type 2   Fibromyalgia    Folliculitis    Headache    migraines   Hemochromatosis-compound heterozygote    Hyperlipidemia    Neuromuscular disorder (HCC)    occasional neuropathy in left foot   Sleep  apnea    past hx- has lost 45lbs- no longer needs CPAP    SURGICAL HISTORY: Past Surgical History:  Procedure Laterality Date   BACK SURGERY  2020   w/ Dr. Wynetta Emery   broken nose surgery     COLONOSCOPY     LUMBAR LAMINECTOMY  07/2021   LUMBAR LAMINECTOMY/DECOMPRESSION MICRODISCECTOMY Left 09/08/2020   Procedure: Microdiscectomy - left - Lumbar five-Sacral one;  Surgeon: Donalee Citrin, MD;  Location: [facility] OR;  Service: Neurosurgery;  Laterality: Left;   NASAL SEPTUM SURGERY     UPPER GI ENDOSCOPY     WISDOM TOOTH EXTRACTION      SOCIAL HISTORY: Social History   Socioeconomic History   Marital status: Single    Spouse name: Not on file   Number of children: Not on file   Years of education: Not on file   Highest education level: Not on file  Occupational History   Occupation: auto Agricultural consultant  Tobacco Use   Smoking status: Never   Smokeless tobacco: Current    Types: Snuff  Vaping Use   Vaping Use: Never used  Substance and Sexual Activity   Alcohol use: Yes    Comment: socially [employer]   Drug use: Never   Sexual activity: Not on file  Other Topics Concern   Not on file  Social History Narrative   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  Patient is single his mother lives with him   Social Determinants of Radio broadcast assistant Strain: Not on file  Food Insecurity: Not on file  Transportation Needs: Not on file  Physical Activity: Not on file  Stress: Not on file  Social Connections: Not on file  Intimate Partner Violence: Not on file    FAMILY HISTORY: Family History  Problem Relation Age of Onset   Hypertension Mother    Diabetes Mother    Hyperlipidemia Mother    Hyperlipidemia Father    Hypertension Father    Colon cancer Neg Hx    Colon polyps Neg Hx    Esophageal cancer Neg Hx    Rectal cancer Neg Hx    Stomach cancer Neg Hx     ALLERGIES:  is allergic to doxepin hcl and naproxen.  MEDICATIONS:  Current Outpatient  Medications  Medication Sig Dispense Refill   acetaminophen (TYLENOL) 500 MG tablet Take 1,000 mg by mouth every 6 (six) hours as needed for moderate pain.      atorvastatin (LIPITOR) 20 MG tablet Take 20 mg by mouth daily.      baclofen (LIORESAL) 10 MG tablet Take 10-20 mg by mouth at bedtime as needed.     Cholecalciferol (VITAMIN D3) 25 MCG (1000 UT) CAPS Take 1,000 Units by mouth daily.      clindamycin (CLEOCIN T) 1 % external solution Apply 1 application topically daily as needed (scalp condition).     doxycycline (VIBRAMYCIN) 50 MG capsule Take 50 mg by mouth daily.     fluticasone (FLONASE) 50 MCG/ACT nasal spray Place 2 sprays into both nostrils daily as needed for allergies.      levocetirizine (XYZAL) 5 MG tablet Take 5 mg by mouth daily as needed for allergies.      metFORMIN (GLUCOPHAGE-XR) 500 MG 24 hr tablet Take 1,000 mg by mouth 2 (two) times daily with a meal.     nortriptyline (PAMELOR) 25 MG capsule Take 25-50 mg by mouth at bedtime as needed for sleep.      Omega-3 Fatty Acids (FISH OIL) 1200 MG CAPS Take 1,200 mg by mouth 3 (three) times daily with meals.     PEG-KCl-NaCl-NaSulf-Na Asc-C (PLENVU) 140 g SOLR Take 1 kit by mouth as directed. 1 each 0   pregabalin (LYRICA) 75 MG capsule Take 75-150 mg by mouth See admin instructions. Take 75 mg by mouth at lunch and 150 mg at bedtime     SUMAtriptan (IMITREX) 100 MG tablet Take 50-100 mg by mouth every 2 (two) hours as needed for migraine.     No current facility-administered medications for this visit.    REVIEW OF SYSTEMS:   Constitutional: ( - ) fevers, ( - )  chills , ( - ) night sweats Eyes: ( - ) blurriness of vision, ( - ) double vision, ( - ) watery eyes Ears, nose, mouth, throat, and face: ( - ) mucositis, ( - ) sore throat Respiratory: ( - ) cough, ( - ) dyspnea, ( - ) wheezes Cardiovascular: ( - ) palpitation, ( - ) chest discomfort, ( - ) lower extremity swelling Gastrointestinal:  ( - ) nausea, ( - )  heartburn, ( - ) change in bowel habits Skin: ( - ) abnormal skin rashes Lymphatics: ( - ) new lymphadenopathy, ( - ) easy bruising Neurological: ( - ) numbness, ( - ) tingling, ( - ) new weaknesses Behavioral/Psych: ( - ) mood change, ( - ) new changes  All other systems were reviewed with the patient and are negative.  PHYSICAL EXAMINATION:  Vitals:   03/29/22 1101  BP: (!) 147/78  Pulse: (!) 58  Resp: 17  Temp: 97.8 F (36.6 C)  SpO2: 100%   Filed Weights   03/29/22 1101  Weight: 149 lb 8 oz (67.8 kg)    GENERAL: Well-appearing middle-age Caucasian male alert, no distress and comfortable SKIN: skin color, texture, turgor are normal, no rashes or significant lesions EYES: conjunctiva are pink and non-injected, sclera clear LUNGS: clear to auscultation and percussion with normal breathing effort HEART: regular rate & rhythm and no murmurs and no lower extremity edema ABDOMEN: soft, non-tender, non-distended, normal bowel sounds Musculoskeletal: no cyanosis of digits and no clubbing  PSYCH: alert & oriented x 3, fluent speech NEURO: no focal motor/sensory deficits  LABORATORY DATA:  I have reviewed the data as listed    Latest Ref Rng & Units 03/29/2022   10:26 AM 12/28/2021   10:52 AM 09/28/2021    1:42 PM  CBC  WBC 4.0 - 10.5 K/uL 4.8   5.2   4.5    Hemoglobin 13.0 - 17.0 g/dL 14.3   13.2   12.8    Hematocrit 39.0 - 52.0 % 41.3   38.5   36.7    Platelets 150 - 400 K/uL 208   237   208         Latest Ref Rng & Units 03/29/2022   10:26 AM 12/28/2021   10:52 AM 09/28/2021    1:42 PM  CMP  Glucose 70 - 99 mg/dL 221   183   175    BUN 6 - 20 mg/dL _0 Creatinine 0.61 - 1.24 mg/dL 0.89   0.93   1.01    Sodium 135 - 145 mmol/L 138   139   141    Potassium 3.5 - 5.1 mmol/L 4.6   4.3   5.0    Chloride 98 - 111 mmol/L 99   102   102    CO2 22 - 32 mmol/L _1 Calcium 8.9 - 10.3 mg/dL 10.3   9.8   9.9    Total Protein 6.5 - 8.1 g/dL 7.0   7.0   7.3     Total Bilirubin 0.3 - 1.2 mg/dL 0.5   0.5   0.7    Alkaline Phos 38 - 126 U/L 76   72   95    AST 15 - 41 U/L _2 ALT 0 - 44 U/L _3 RADIOGRAPHIC STUDIES:  No results found.  ASSESSMENT & PLAN [name] 56 y.o. male with medical history significant for compound heterozygous hemochromatosis who presents for a follow up visit.  After review of the labs, review of the records, and discussion with the patient the patients findings are most consistent with a compound heterozygote hemochromatosis.  Typically these patients have more benign clinical courses and rarely suffer from iron overload.  Therefore I do not think we need a particular restrictive goal for his ferritin.  Patient has had a consistently low ferritin since at least 2012.  I would recommend that we try to keep his levels down from extreme, but elevations into the normal range would not be unreasonable at this time.  The patient voices understanding  of this plan moving forward.   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  Last phlebotomy performed in May 2022 --We will check CBC, CMP, iron panel, and ferritin at each visit. --Have patient return to clinic in 6 months for labs and 12 months for return clinic visit  #Hemoccult Positive Stools # Persistently Low Iron -- Patient currently following with Dr. Carlean Purl and GI --Scheduled for EGD and colonoscopy 04/18/2022  No orders of the defined types were placed in this encounter.  All questions were answered. The patient knows to call the clinic with any problems, questions or concerns.  A total of more than 30 minutes were spent on this encounter with face-to-face time and non-face-to-face time, including preparing to see the patient, ordering tests and/or medications, counseling  the patient and coordination of care as outlined above.   Ledell Peoples, MD Department of Hematology/Oncology [place] at [facility] Phone: [phone] Pager: [phone] Email: [email or web address]  03/29/2022 2:05 PM

## 2022-04-11 ENCOUNTER — Encounter: Payer: Self-pay | Admitting: Internal Medicine

## 2022-04-18 ENCOUNTER — Encounter: Payer: Self-pay | Admitting: Internal Medicine

## 2022-04-18 ENCOUNTER — Ambulatory Visit (AMBULATORY_SURGERY_CENTER): Payer: BC Managed Care – PPO | Admitting: Internal Medicine

## 2022-04-18 VITALS — BP 117/66 | HR 49 | Temp 97.5°F | Resp 19 | Ht 67.0 in | Wt 152.0 lb

## 2022-04-18 DIAGNOSIS — D122 Benign neoplasm of ascending colon: Secondary | ICD-10-CM

## 2022-04-18 DIAGNOSIS — K6289 Other specified diseases of anus and rectum: Secondary | ICD-10-CM

## 2022-04-18 DIAGNOSIS — D509 Iron deficiency anemia, unspecified: Secondary | ICD-10-CM | POA: Diagnosis not present

## 2022-04-18 DIAGNOSIS — K634 Enteroptosis: Secondary | ICD-10-CM | POA: Diagnosis not present

## 2022-04-18 DIAGNOSIS — D124 Benign neoplasm of descending colon: Secondary | ICD-10-CM

## 2022-04-18 DIAGNOSIS — R195 Other fecal abnormalities: Secondary | ICD-10-CM

## 2022-04-18 MED ORDER — SODIUM CHLORIDE 0.9 % IV SOLN
500.0000 mL | Freq: Once | INTRAVENOUS | Status: DC
Start: 2022-04-18 — End: 2022-04-18

## 2022-04-18 NOTE — Progress Notes (Signed)
Vs by DT in adm  Pt's states no medical or surgical changes since previsit or office visit.   Blood sugar 72 in admitting ,pt states he feels a little lethargic and that when his blood sugar gets in low 70's it drops fast, D5w 500 cc hung piggybacked into NS. Will recheck BS in 15 min.  1418 BS rechecked =80. Dr Carlean Purl & C Powers,CRNA notified & D5W IV continues as ordered .  1435- Pt taken to procedure room by [facility] Powers,CRNA. 200 cc D5W received IV

## 2022-04-18 NOTE — Progress Notes (Signed)
Called to room to assist during endoscopic procedure.  Patient ID and intended procedure confirmed with present staff. Received instructions for my participation in the procedure from the performing physician.  

## 2022-04-18 NOTE — Op Note (Signed)
[place] Patient Name: [name] Procedure Date: 04/18/2022 2:38 PM MRN: [id number] Endoscopist: Gatha Mayer , MD Age: 56 Referring MD:  Date of Birth: 11-May-1966 Gender: Male Account #: [id number] Procedure:                Upper GI endoscopy Indications:              Iron deficiency anemia, Heme positive stool Medicines:                Monitored Anesthesia Care Procedure:                Pre-Anesthesia Assessment:                           - Prior to the procedure, a History and Physical                            was performed, and patient medications and                            allergies were reviewed. The patient's tolerance of                            previous anesthesia was also reviewed. The risks                            and benefits of the procedure and the sedation                            options and risks were discussed with the patient.                            All questions were answered, and informed consent                            was obtained. Prior Anticoagulants: The patient has                            taken no previous anticoagulant or antiplatelet                            agents. ASA Grade Assessment: III - A patient with                            severe systemic disease. After reviewing the risks                            and benefits, the patient was deemed in                            satisfactory condition to undergo the procedure.                           After obtaining informed consent, the endoscope was  passed under direct vision. Throughout the                            procedure, the patient's blood pressure, pulse, and                            oxygen saturations were monitored continuously. The                            GIF HQ190 #[id number] was introduced through the                            mouth, and advanced to the second part of duodenum.                            The upper  GI endoscopy was accomplished without                            difficulty. The patient tolerated the procedure                            well. Scope In: Scope Out: Findings:                 The esophagus was normal.                           The stomach was normal.                           The examined duodenum was normal.                           The cardia and gastric fundus were normal on                            retroflexion.                           Biopsies for histology were taken with a cold                            forceps in the entire duodenum for evaluation of                            celiac disease. Verification of patient                            identification for the specimen was done. Estimated                            blood loss was minimal. Complications:            No immediate complications. Estimated Blood Loss:     Estimated blood loss was minimal. Impression:               - Normal esophagus.                           -  Normal stomach.                           - Normal examined duodenum.                           - Biopsies were taken with a cold forceps for                            evaluation of celiac disease. Recommendation:           - See the other procedure note for documentation of                            additional recommendations. colonoscopy next                           - Await pathology results. Gatha Mayer, MD 04/18/2022 3:24:26 PM This report has been signed electronically.

## 2022-04-18 NOTE — Patient Instructions (Addendum)
The upper exam looked normal. I took small intestine biopsies looking for possible malabsorption problems. I found 3 colon polyps and think hemorrhoids were swollen this time. No clear cause of problems found though hemorrhoids or polyps could cause the microscopic blood in stool would not cause the iron to be low.  Once I see pathology results will make recommendations.  I appreciate the opportunity to care for you. Gatha Mayer, MD, FACG  YOU HAD AN ENDOSCOPIC PROCEDURE TODAY AT [facility]:   Refer to the procedure report that was given to you for any specific questions about what was found during the examination.  If the procedure report does not answer your questions, please call your gastroenterologist to clarify.  If you requested that your care partner not be given the details of your procedure findings, then the procedure report has been included in a sealed envelope for you to review at your convenience later.  YOU SHOULD EXPECT: Some feelings of bloating in the abdomen. Passage of more gas than usual.  Walking can help get rid of the air that was put into your GI tract during the procedure and reduce the bloating. If you had a lower endoscopy (such as a colonoscopy or flexible sigmoidoscopy) you may notice spotting of blood in your stool or on the toilet paper. If you underwent a bowel prep for your procedure, you may not have a normal bowel movement for a few days.  Please Note:  You might notice some irritation and congestion in your nose or some drainage.  This is from the oxygen used during your procedure.  There is no need for concern and it should clear up in a day or so.  SYMPTOMS TO REPORT IMMEDIATELY:  Following lower endoscopy (colonoscopy or flexible sigmoidoscopy):  Excessive amounts of blood in the stool  Significant tenderness or worsening of abdominal pains  Swelling of the abdomen that is new, acute  Fever of 100F or higher  Following upper  endoscopy (EGD)  Vomiting of blood or coffee ground material  New chest pain or pain under the shoulder blades  Painful or persistently difficult swallowing  New shortness of breath  Fever of 100F or higher  Black, tarry-looking stools  For urgent or emergent issues, a gastroenterologist can be reached at any hour by calling [phone]. Do not use MyChart messaging for urgent concerns.    DIET:  We do recommend a small meal at first, but then you may proceed to your regular diet.  Drink plenty of fluids but you should avoid alcoholic beverages for 24 hours.  ACTIVITY:  You should plan to take it easy for the rest of today and you should NOT DRIVE or use heavy machinery until tomorrow (because of the sedation medicines used during the test).    FOLLOW UP: Our staff will call the number listed on your records 24-72 hours following your procedure to check on you and address any questions or concerns that you may have regarding the information given to you following your procedure. If we do not reach you, we will leave a message.  We will attempt to reach you two times.  During this call, we will ask if you have developed any symptoms of COVID 19. If you develop any symptoms (ie: fever, flu-like symptoms, shortness of breath, cough etc.) before then, please call [phone].  If you test positive for Covid 19 in the 2 weeks post procedure, please call and report this information to [place].  If any biopsies were taken you will be contacted by phone or by letter within the next 1-3 weeks.  Please call us at [phone] if you have not heard about the biopsies in 3 weeks.    SIGNATURES/CONFIDENTIALITY: You and/or your care partner have signed paperwork which will be entered into your electronic medical record.  These signatures attest to the fact that that the information above on your After Visit Summary has been reviewed and is understood.  Full responsibility of the confidentiality of this  discharge information lies with you and/or your care-partner.

## 2022-04-18 NOTE — Progress Notes (Signed)
A and O x3. Report to RN. Tolerated MAC anesthesia well.Teeth unchanged after procedure. 

## 2022-04-18 NOTE — Op Note (Signed)
[place] Patient Name: [name] Procedure Date: 04/18/2022 2:38 PM MRN: [id number] Endoscopist: Gatha Mayer , MD Age: 56 Referring MD:  Date of Birth: Mar 01, 1966 Gender: Male Account #: [id number] Procedure:                Colonoscopy Indications:              Heme positive stool, Iron deficiency anemia Medicines:                Monitored Anesthesia Care Procedure:                Pre-Anesthesia Assessment:                           - Prior to the procedure, a History and Physical                            was performed, and patient medications and                            allergies were reviewed. The patient's tolerance of                            previous anesthesia was also reviewed. The risks                            and benefits of the procedure and the sedation                            options and risks were discussed with the patient.                            All questions were answered, and informed consent                            was obtained. Prior Anticoagulants: The patient has                            taken no previous anticoagulant or antiplatelet                            agents. ASA Grade Assessment: III - A patient with                            severe systemic disease. After reviewing the risks                            and benefits, the patient was deemed in                            satisfactory condition to undergo the procedure.                           After obtaining informed consent, the colonoscope  was passed under direct vision. Throughout the                            procedure, the patient's blood pressure, pulse, and                            oxygen saturations were monitored continuously. The                            CF [id number] #[id number] was introduced through the anus                            and advanced to the the cecum, identified by                            appendiceal  orifice and ileocecal valve. The                            colonoscopy was performed without difficulty. The                            patient tolerated the procedure well. The quality                            of the bowel preparation was excellent. The                            ileocecal valve, appendiceal orifice, and rectum                            were photographed. The bowel preparation used was                            Plenvu via split dose instruction. Scope In: 2:53:53 PM Scope Out: 3:15:01 PM Scope Withdrawal Time: 0 hours 17 minutes 10 seconds  Total Procedure Duration: 0 hours 21 minutes 8 seconds  Findings:                 The perianal and digital rectal examinations were                            normal.                           Three sessile polyps were found in the descending                            colon and ascending colon. The polyps were 2 to 15                            mm in size. These polyps were removed with a cold                            snare. 15 mm polyp in 2 pieces.  Resection and                            retrieval were complete. Verification of patient                            identification for the specimen was done. Estimated                            blood loss was minimal.                           External and internal hemorrhoids were found.                           Anal papilla(e) were hypertrophied.                           The exam was otherwise without abnormality on                            direct and retroflexion views. Complications:            No immediate complications. Estimated Blood Loss:     Estimated blood loss was minimal. Impression:               - Three 2 to 15 mm polyps in the descending colon                            and in the ascending colon, removed with a cold                            snare. Resected and retrieved.                           - External and internal hemorrhoids.                            - Anal papilla(e) were hypertrophied.                           - The examination was otherwise normal on direct                            and retroflexion views. polyps or hemorrhoids could                            cause heme + stool but not iron-deficiency -                            consider capsule endoscopy pending path review Recommendation:           - Patient has a contact number available for                            emergencies. The signs and symptoms of potential  delayed complications were discussed with the                            patient. Return to normal activities tomorrow.                            Written discharge instructions were provided to the                            patient.                           - Resume previous diet.                           - Continue present medications.                           - Await pathology results.                           - Repeat colonoscopy is recommended. The                            colonoscopy date will be determined after pathology                            results from today's exam become available for                            review. Gatha Mayer, MD 04/18/2022 3:28:44 PM This report has been signed electronically.

## 2022-04-19 ENCOUNTER — Telehealth: Payer: Self-pay

## 2022-04-25 ENCOUNTER — Encounter: Payer: Self-pay | Admitting: Internal Medicine

## 2022-04-25 DIAGNOSIS — Z86010 Personal history of colonic polyps: Secondary | ICD-10-CM | POA: Insufficient documentation

## 2022-04-25 DIAGNOSIS — Z860101 Personal history of adenomatous and serrated colon polyps: Secondary | ICD-10-CM

## 2022-04-25 HISTORY — DX: Personal history of colonic polyps: Z86.010

## 2022-04-25 HISTORY — DX: Personal history of adenomatous and serrated colon polyps: Z86.0101

## 2022-09-27 ENCOUNTER — Inpatient Hospital Stay: Payer: BC Managed Care – PPO | Attending: Hematology and Oncology

## 2022-09-27 LAB — CBC WITH DIFFERENTIAL (CANCER CENTER ONLY)
Abs Immature Granulocytes: 0 10*3/uL (ref 0.00–0.07)
Basophils Absolute: 0 10*3/uL (ref 0.0–0.1)
Basophils Relative: 1 %
Eosinophils Absolute: 0.2 10*3/uL (ref 0.0–0.5)
Eosinophils Relative: 5 %
HCT: 40.1 % (ref 39.0–52.0)
Hemoglobin: 14.2 g/dL (ref 13.0–17.0)
Immature Granulocytes: 0 %
Lymphocytes Relative: 28 %
Lymphs Abs: 1.1 10*3/uL (ref 0.7–4.0)
MCH: 34.1 pg — ABNORMAL HIGH (ref 26.0–34.0)
MCHC: 35.4 g/dL (ref 30.0–36.0)
MCV: 96.2 fL (ref 80.0–100.0)
Monocytes Absolute: 0.4 10*3/uL (ref 0.1–1.0)
Monocytes Relative: 11 %
Neutro Abs: 2.1 10*3/uL (ref 1.7–7.7)
Neutrophils Relative %: 55 %
Platelet Count: 197 10*3/uL (ref 150–400)
RBC: 4.17 MIL/uL — ABNORMAL LOW (ref 4.22–5.81)
RDW: 12.2 % (ref 11.5–15.5)
WBC Count: 3.9 10*3/uL — ABNORMAL LOW (ref 4.0–10.5)
nRBC: 0 % (ref 0.0–0.2)

## 2022-09-27 LAB — CMP (CANCER CENTER ONLY)
ALT: 20 U/L (ref 0–44)
AST: 25 U/L (ref 15–41)
Albumin: 4.6 g/dL (ref 3.5–5.0)
Alkaline Phosphatase: 68 U/L (ref 38–126)
Anion gap: 7 (ref 5–15)
BUN: 13 mg/dL (ref 6–20)
CO2: 31 mmol/L (ref 22–32)
Calcium: 9.9 mg/dL (ref 8.9–10.3)
Chloride: 101 mmol/L (ref 98–111)
Creatinine: 0.91 mg/dL (ref 0.61–1.24)
GFR, Estimated: >60 mL/min (ref >60)
Glucose, Bld: 191 mg/dL — ABNORMAL HIGH (ref 70–99)
Potassium: 4.6 mmol/L (ref 3.5–5.1)
Sodium: 139 mmol/L (ref 135–145)
Total Bilirubin: 0.7 mg/dL (ref 0.3–1.2)
Total Protein: 6.8 g/dL (ref 6.5–8.1)

## 2022-09-27 LAB — FERRITIN: Ferritin: 27 ng/mL (ref 24–336)

## 2022-10-01 ENCOUNTER — Telehealth: Payer: Self-pay

## 2022-10-01 NOTE — Telephone Encounter (Signed)
Attempted to call patient on primary number regarding recent lab results. No answer. Left voicemail providing patient with phone number to callback for results.

## 2022-10-01 NOTE — Telephone Encounter (Signed)
-----   Message from Otila Kluver, RN sent at 09/30/2022  5:43 PM EST -----  ----- Message ----- From: Orson Slick, MD Sent: 09/29/2022   6:38 PM EST To: Otila Kluver, RN  Please let Mr. Edell know that his ferritin is currently at [facility].  Will plan to see him back in June 2024. ----- Message ----- From: Buel Ream, Lab In [place] Sent: 09/27/2022  10:31 AM EST To: Orson Slick, MD

## 2022-10-22 IMAGING — RF DG LUMBAR SPINE 2-3V
1 series · 2 of 2 positions shown · non-contrast
Comparison: Lumbar spine radiographs 09/08/2020 scratched at CT of
the lumbar spine without contrast 08/02/2011

CLINICAL DATA: L5-S1 PLIF.

EXAM:
LUMBAR SPINE - 2-3 VIEW

[Series 1: run · 2 of 2 slices shown]
[im 1/2]
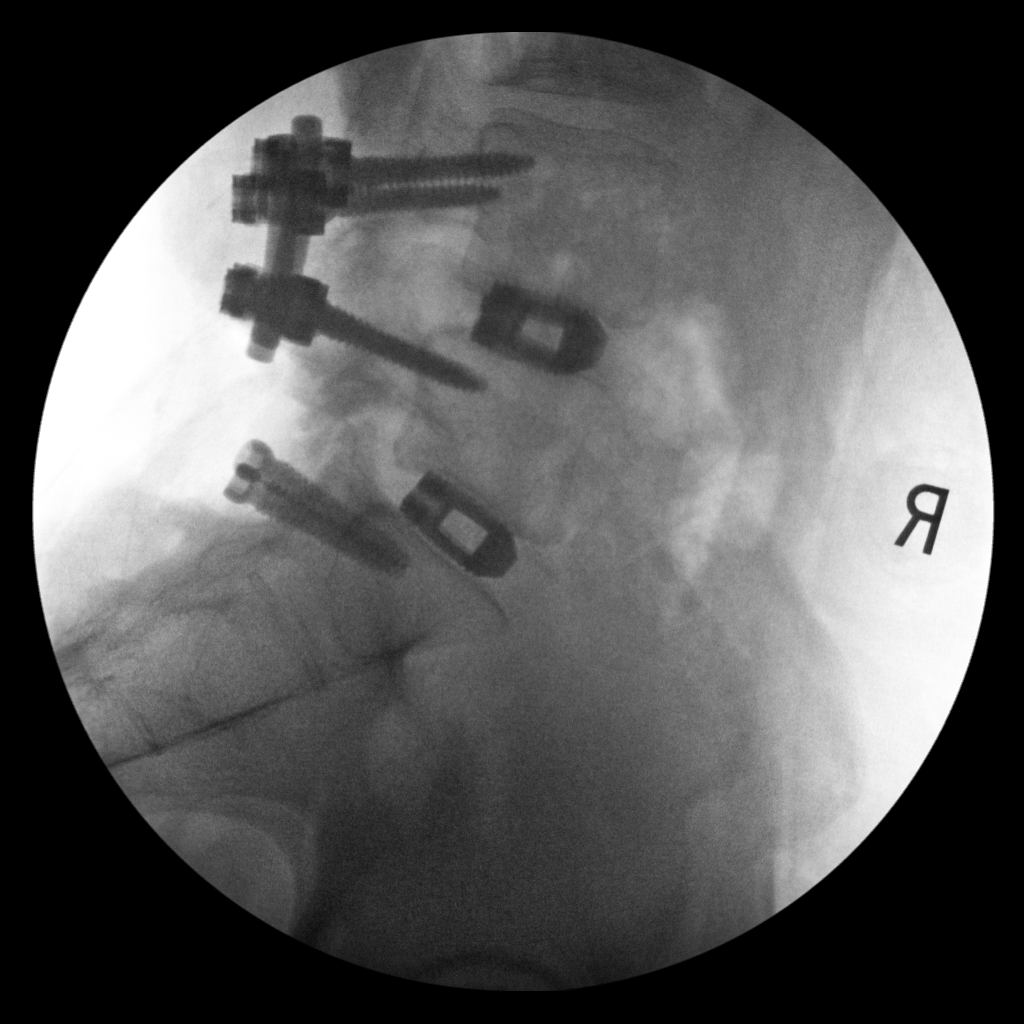
[im 2/2]
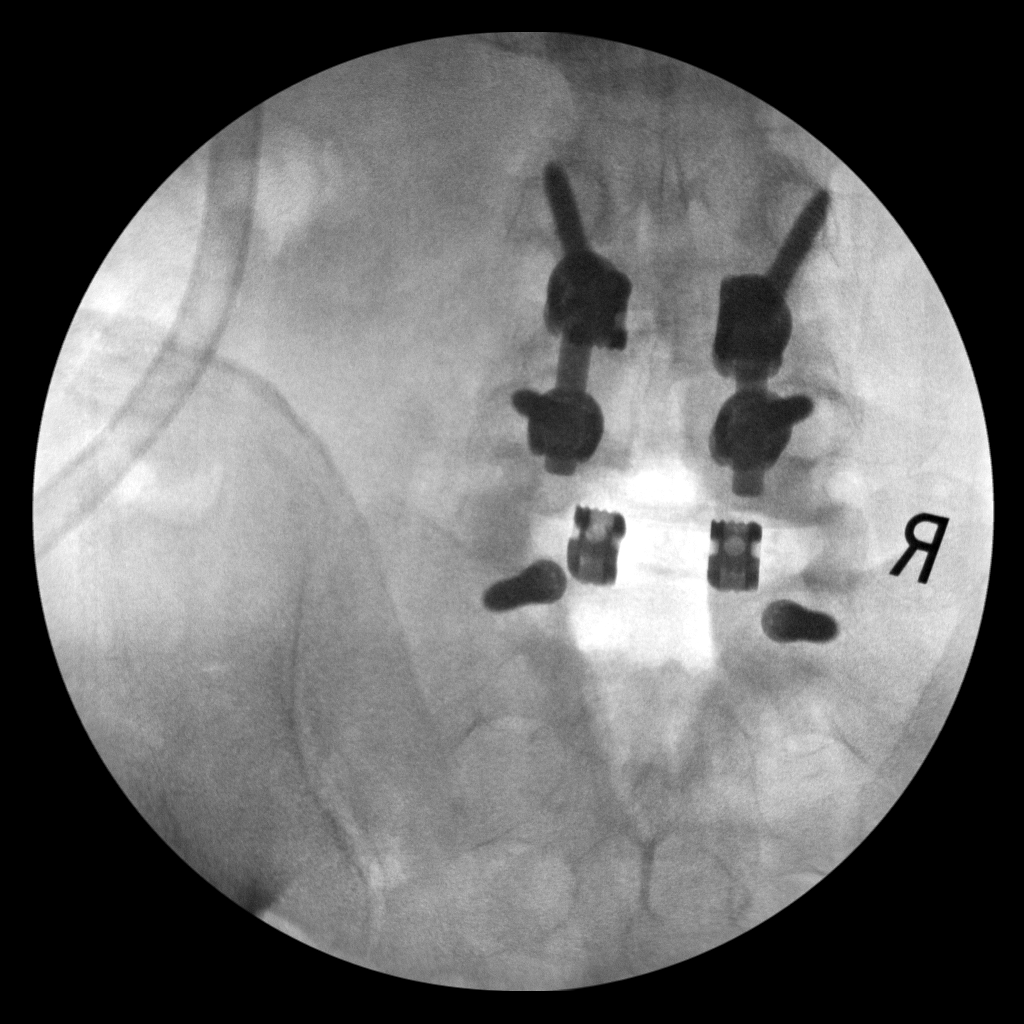

[2 of 2 positions shown; findings below may reference images not displayed]

FINDINGS: Previous fusion at L4-5 again noted. Pedicle screws are now in place
at S1. Disc spacers are in place. Disc height restored.
IMPRESSION: Interval extension of PLIF at S1 without radiographic evidence for
complication.

## 2022-12-13 ENCOUNTER — Other Ambulatory Visit: Payer: Self-pay | Admitting: Oral Surgery

## 2023-04-04 ENCOUNTER — Other Ambulatory Visit: Payer: Self-pay | Admitting: Hematology and Oncology

## 2023-04-04 ENCOUNTER — Inpatient Hospital Stay: Payer: BC Managed Care – PPO | Attending: Hematology and Oncology

## 2023-04-04 ENCOUNTER — Other Ambulatory Visit: Payer: Self-pay

## 2023-04-04 ENCOUNTER — Inpatient Hospital Stay: Payer: BC Managed Care – PPO | Admitting: Hematology and Oncology

## 2023-04-04 LAB — CBC WITH DIFFERENTIAL (CANCER CENTER ONLY)
Abs Immature Granulocytes: 0.01 10*3/uL (ref 0.00–0.07)
Basophils Absolute: 0 10*3/uL (ref 0.0–0.1)
Basophils Relative: 1 %
Eosinophils Absolute: 0.1 10*3/uL (ref 0.0–0.5)
Eosinophils Relative: 2 %
HCT: 39.7 % (ref 39.0–52.0)
Hemoglobin: 14.3 g/dL (ref 13.0–17.0)
Immature Granulocytes: 0 %
Lymphocytes Relative: 27 %
Lymphs Abs: 1 10*3/uL (ref 0.7–4.0)
MCH: 34.2 pg — ABNORMAL HIGH (ref 26.0–34.0)
MCHC: 36 g/dL (ref 30.0–36.0)
MCV: 95 fL (ref 80.0–100.0)
Monocytes Absolute: 0.4 10*3/uL (ref 0.1–1.0)
Monocytes Relative: 11 %
Neutro Abs: 2.3 10*3/uL (ref 1.7–7.7)
Neutrophils Relative %: 59 %
Platelet Count: 205 10*3/uL (ref 150–400)
RBC: 4.18 MIL/uL — ABNORMAL LOW (ref 4.22–5.81)
RDW: 12 % (ref 11.5–15.5)
WBC Count: 3.8 10*3/uL — ABNORMAL LOW (ref 4.0–10.5)
nRBC: 0 % (ref 0.0–0.2)

## 2023-04-04 LAB — CMP (CANCER CENTER ONLY)
ALT: 25 U/L (ref 0–44)
AST: 26 U/L (ref 15–41)
Albumin: 4.6 g/dL (ref 3.5–5.0)
Alkaline Phosphatase: 64 U/L (ref 38–126)
Anion gap: 7 (ref 5–15)
BUN: 12 mg/dL (ref 6–20)
CO2: 32 mmol/L (ref 22–32)
Calcium: 10.6 mg/dL — ABNORMAL HIGH (ref 8.9–10.3)
Chloride: 100 mmol/L (ref 98–111)
Creatinine: 0.98 mg/dL (ref 0.61–1.24)
GFR, Estimated: >60 mL/min (ref >60)
Glucose, Bld: 167 mg/dL — ABNORMAL HIGH (ref 70–99)
Potassium: 4.5 mmol/L (ref 3.5–5.1)
Sodium: 139 mmol/L (ref 135–145)
Total Bilirubin: 0.7 mg/dL (ref 0.3–1.2)
Total Protein: 7 g/dL (ref 6.5–8.1)

## 2023-04-04 LAB — IRON AND IRON BINDING CAPACITY (CC-WL,HP ONLY)
Iron: 136 ug/dL (ref 45–182)
Saturation Ratios: 41 % — ABNORMAL HIGH (ref 17.9–39.5)
TIBC: 329 ug/dL (ref 250–450)
UIBC: 193 ug/dL (ref 117–376)

## 2023-04-04 LAB — FERRITIN: Ferritin: 22 ng/mL — ABNORMAL LOW (ref 24–336)

## 2023-04-04 NOTE — Progress Notes (Signed)
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 Leonides Schanz. Phlebotomies held.    Interval History:  [name] 57 y.o. male with medical history significant for compound heterozygous hemochromatosis who presents for a follow up visit. The patient's last visit was on 03/29/2022. In the interim since the last visit he underwent colonoscopy and endoscopy on 04/18/2022.  On exam today [name] notes he has not noticed any overt signs of bleeding.  He denies any dark stools, redness stool and reports no changes in his diet.  He notes his weight is stable.  He eats red meat approximately twice per week but does eat "a lot of chicken".  He notes his energy levels are quite good.  He has not had any recent illnesses with no nausea, vomiting, or diarrhea.  He denies any fevers, chills, sweats, lightheadedness, or shortness of breath.  He did have a callus on his tongue which was biopsied and no malignancy was noted.  Otherwise he has been at his baseline level of health.  A full 10 point ROS is listed below.  MEDICAL HISTORY:  Past Medical History:  Diagnosis Date   Allergy    Anemia    DDD (degenerative disc disease), cervical    DDD (degenerative disc disease), lumbar    Depression    Diabetes mellitus without complication (HCC)    type 2   Fibromyalgia    Folliculitis    Headache    migraines   Hemochromatosis-compound heterozygote    Hx of adenomatous colonic polyps 04/25/2022   Hyperlipidemia    Neuromuscular disorder (HCC)    occasional neuropathy in left foot   Sleep apnea    past hx- has lost 45lbs- no longer needs CPAP    SURGICAL  HISTORY: Past Surgical History:  Procedure Laterality Date   BACK SURGERY  2020   w/ Dr. Wynetta Emery   broken nose surgery     COLONOSCOPY     LUMBAR LAMINECTOMY  07/2021   LUMBAR LAMINECTOMY/DECOMPRESSION MICRODISCECTOMY Left 09/08/2020   Procedure: Microdiscectomy - left - Lumbar five-Sacral one;  Surgeon: Donalee Citrin, MD;  Location: [facility] OR;  Service: Neurosurgery;  Laterality: Left;   NASAL SEPTUM SURGERY     UPPER GI ENDOSCOPY     WISDOM TOOTH EXTRACTION      SOCIAL HISTORY: Social History   Socioeconomic History   Marital status: Single    Spouse name: Not on file   Number of children: Not on file   Years of education: Not on file   Highest education level: Not on file  Occupational History   Occupation: auto Agricultural consultant  Tobacco Use   Smoking status: Never   Smokeless tobacco: Current    Types: Snuff  Vaping Use   Vaping Use: Never used  Substance and Sexual Activity   Alcohol use: Yes    Comment: socially [employer]V   Drug use: Never   Sexual activity: Not on file  Other Topics Concern   Not on file  Social History Narrative   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   Patient is single his mother lives with him   Social Determinants of Health   Financial Resource Strain: Not on  file  Food Insecurity: Not on file  Transportation Needs: Not on file  Physical Activity: Not on file  Stress: Not on file  Social Connections: Not on file  Intimate Partner Violence: Not on file    FAMILY HISTORY: Family History  Problem Relation Age of Onset   Hypertension Mother    Diabetes Mother    Hyperlipidemia Mother    Hyperlipidemia Father    Hypertension Father    Stomach cancer Paternal Uncle    Colon cancer Neg Hx    Colon polyps Neg Hx    Esophageal cancer Neg Hx    Rectal cancer Neg Hx     ALLERGIES:  is allergic to doxepin hcl and naproxen.  MEDICATIONS:  Current Outpatient Medications  Medication Sig Dispense Refill   acetaminophen (TYLENOL)  500 MG tablet Take 1,000 mg by mouth every 6 (six) hours as needed for moderate pain.      atorvastatin (LIPITOR) 20 MG tablet Take 20 mg by mouth daily.      baclofen (LIORESAL) 10 MG tablet Take 10-20 mg by mouth at bedtime as needed.     Cholecalciferol (VITAMIN D3) 25 MCG (1000 UT) CAPS Take 1,000 Units by mouth daily.      clindamycin (CLEOCIN T) 1 % external solution Apply 1 application topically daily as needed (scalp condition). (Patient not taking: Reported on 04/18/2022)     doxycycline (VIBRAMYCIN) 50 MG capsule Take 50 mg by mouth daily.     fluticasone (FLONASE) 50 MCG/ACT nasal spray Place 2 sprays into both nostrils daily as needed for allergies.  (Patient not taking: Reported on 04/18/2022)     levocetirizine (XYZAL) 5 MG tablet Take 5 mg by mouth daily as needed for allergies.  (Patient not taking: Reported on 04/18/2022)     metFORMIN (GLUCOPHAGE-XR) 500 MG 24 hr tablet Take 1,000 mg by mouth 2 (two) times daily with a meal.     nortriptyline (PAMELOR) 25 MG capsule Take 25-50 mg by mouth at bedtime as needed for sleep.      Omega-3 Fatty Acids (FISH OIL) 1200 MG CAPS Take 1,200 mg by mouth 3 (three) times daily with meals.     pregabalin (LYRICA) 75 MG capsule Take 75-150 mg by mouth See admin instructions. Take 75 mg by mouth at lunch and 150 mg at bedtime     SUMAtriptan (IMITREX) 100 MG tablet Take 50-100 mg by mouth every 2 (two) hours as needed for migraine.     No current facility-administered medications for this visit.    REVIEW OF SYSTEMS:   Constitutional: ( - ) fevers, ( - )  chills , ( - ) night sweats Eyes: ( - ) blurriness of vision, ( - ) double vision, ( - ) watery eyes Ears, nose, mouth, throat, and face: ( - ) mucositis, ( - ) sore throat Respiratory: ( - ) cough, ( - ) dyspnea, ( - ) wheezes Cardiovascular: ( - ) palpitation, ( - ) chest discomfort, ( - ) lower extremity swelling Gastrointestinal:  ( - ) nausea, ( - ) heartburn, ( - ) change in bowel  habits Skin: ( - ) abnormal skin rashes Lymphatics: ( - ) new lymphadenopathy, ( - ) easy bruising Neurological: ( - ) numbness, ( - ) tingling, ( - ) new weaknesses Behavioral/Psych: ( - ) mood change, ( - ) new changes  All other systems were reviewed with the patient and are negative.  PHYSICAL EXAMINATION:  Vitals:   04/04/23 1147  BP: (!) 158/77  Pulse: (!) 58  Resp: 15  Temp: (!) 97.2 F (36.2 C)  SpO2: 100%    Filed Weights   04/04/23 1147  Weight: 150 lb (68 kg)     GENERAL: Well-appearing middle-age Caucasian male alert, no distress and comfortable SKIN: skin color, texture, turgor are normal, no rashes or significant lesions EYES: conjunctiva are pink and non-injected, sclera clear LUNGS: clear to auscultation and percussion with normal breathing effort HEART: regular rate & rhythm and no murmurs and no lower extremity edema ABDOMEN: soft, non-tender, non-distended, normal bowel sounds Musculoskeletal: no cyanosis of digits and no clubbing  PSYCH: alert & oriented x 3, fluent speech NEURO: no focal motor/sensory deficits  LABORATORY DATA:  I have reviewed the data as listed    Latest Ref Rng & Units 04/04/2023   11:23 AM 09/27/2022   10:17 AM 03/29/2022   10:26 AM  CBC  WBC 4.0 - 10.5 K/uL 3.8  3.9  4.8   Hemoglobin 13.0 - 17.0 g/dL 45.4  09.8  11.9   Hematocrit 39.0 - 52.0 % 39.7  40.1  41.3   Platelets 150 - 400 K/uL 205  197  208        Latest Ref Rng & Units 04/04/2023   11:23 AM 09/27/2022   10:17 AM 03/29/2022   10:26 AM  CMP  Glucose 70 - 99 mg/dL 147  829  562   BUN 6 - 20 mg/dL 12  13  14    Creatinine 0.61 - 1.24 mg/dL 1.30  8.65  7.84   Sodium 135 - 145 mmol/L 139  139  138   Potassium 3.5 - 5.1 mmol/L 4.5  4.6  4.6   Chloride 98 - 111 mmol/L 100  101  99   CO2 22 - 32 mmol/L 32  31  31   Calcium 8.9 - 10.3 mg/dL 69.6  9.9  29.5   Total Protein 6.5 - 8.1 g/dL 7.0  6.8  7.0   Total Bilirubin 0.3 - 1.2 mg/dL 0.7  0.7  0.5   Alkaline Phos 38 -  126 U/L 64  68  76   AST 15 - 41 U/L 26  25  21    ALT 0 - 44 U/L 25  20  18      RADIOGRAPHIC STUDIES:  No results found.  ASSESSMENT & PLAN [name] 57 y.o. male with medical history significant for compound heterozygous hemochromatosis who presents for a follow up visit.  After review of the labs, review of the records, and discussion with the patient the patients findings are most consistent with a compound heterozygote hemochromatosis.  Typically these patients have more benign clinical courses and rarely suffer from iron overload.  Therefore I do not think we need a particular restrictive goal for his ferritin.  Patient has had a consistently low ferritin since at least 2012.  I would recommend that we try to keep his levels down from extreme, but elevations into the normal range would not be unreasonable at this time.  The patient voices understanding of this plan moving forward.   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  Last phlebotomy performed in May 2022 --We will check CBC, CMP, iron panel, and ferritin at each visit. --labs today show ferritin of 22, hemoglobin 14.3, MCV 95, platelets 205, and white blood cell count 3.8 --Have patient  return to clinic in 6 months for labs and return clinic visit  #Hemoccult Positive Stools # Persistently Low Iron -- Patient currently following with Dr. Leone Payor and GI --underwent EGD and colonoscopy on 04/18/2022 -- Will reach out to GI given his persistent iron deficiency for consideration of capsule endoscopy.  No orders of the defined types were placed in this encounter.  All questions were answered. The patient knows to call the clinic with any problems, questions or concerns.  A total of more than 30 minutes were spent on this encounter with face-to-face time  and non-face-to-face time, including preparing to see the patient, ordering tests and/or medications, counseling the patient and coordination of care as outlined above.   Ulysees Barns, MD Department of Hematology/Oncology [facility] Phone: [phone] Pager: [phone] Email: [email or web address]  04/06/2023 7:07 PM

## 2023-04-07 ENCOUNTER — Telehealth: Payer: Self-pay | Admitting: *Deleted

## 2023-04-07 ENCOUNTER — Telehealth: Payer: Self-pay | Admitting: Hematology and Oncology

## 2023-04-07 NOTE — Telephone Encounter (Signed)
-----   Message from Jaci Standard, MD sent at 04/06/2023  6:56 PM EDT ----- Please let [name] know that his ferritin is 22.  It is actually decreased from his prior visit when it was at 27.  I would recommend further evaluation with Dr. Leone Payor.  I have reached out to Dr. Leone Payor regarding the results of his labs.  Will plan to see the patient back in 6 months for clinic visit and labs   ----- Message ----- From: Leory Plowman, Lab In [place] Sent: 04/04/2023  11:34 AM EDT To: Jaci Standard, MD

## 2023-04-07 NOTE — Telephone Encounter (Signed)
TCT patient regarding recent lab results.  No answer but was able to leave vm message on his identified phone vm. Advised that his ferritin is 22. It is actually decreased from his prior visit when it was at 27. Dr. Leonides Schanz  recommends further evaluation with Dr. Leone Payor. Dr. Leonides Schanz has reached out to Dr. Leone Payor regarding the results of his labs. Will plan to see the patient back in 6 months for clinic visit and labs  Advised that pt call back with any questions or concerns. Provide call back # of [phone]

## 2023-04-10 ENCOUNTER — Telehealth: Payer: Self-pay | Admitting: Internal Medicine

## 2023-04-10 DIAGNOSIS — D509 Iron deficiency anemia, unspecified: Secondary | ICD-10-CM

## 2023-04-10 DIAGNOSIS — R195 Other fecal abnormalities: Secondary | ICD-10-CM

## 2023-04-10 NOTE — Telephone Encounter (Signed)
Dr. Leonides Schanz messaged re: persistently low ferritin and raised ? Of doing capsule endoscopy  I will contact the patient about this

## 2023-04-10 NOTE — Telephone Encounter (Signed)
I called and left a message re: that Dr. Leonides Schanz had asked about doing a capsule endoscopy of small bowel since [name] still has low iron levels.  I think that is appropriate to do and would like him to schedule.  Please contact again later today or tomorrow and arrange this test. Would be ideal to get set up before 6/22 if possible in case insurance wants to have had it done within 1 year of EGD and colonoscopy (which were done 04/18/22)  Encounter Diagnoses  Name Primary?   Heme positive stool Yes   Iron deficiency anemia, unspecified iron deficiency anemia type

## 2023-04-11 ENCOUNTER — Other Ambulatory Visit: Payer: Self-pay

## 2023-04-11 DIAGNOSIS — D509 Iron deficiency anemia, unspecified: Secondary | ICD-10-CM

## 2023-04-11 DIAGNOSIS — R195 Other fecal abnormalities: Secondary | ICD-10-CM

## 2023-04-11 NOTE — Telephone Encounter (Signed)
Spoke with patient & he did receive Dr. Marvell Fuller message yesterday, and is ready to schedule. Capsule scheduled for 04/17/23 at 8:30 am. Amb ref placed. Instructions sent to mychart & advised patient to call back with any questions. Pt verbalized all understanding.

## 2023-04-17 ENCOUNTER — Telehealth: Payer: Self-pay | Admitting: Internal Medicine

## 2023-04-17 NOTE — Telephone Encounter (Signed)
Have him do a MiraLax colonoscopy prep instead, please

## 2023-04-17 NOTE — Telephone Encounter (Signed)
Inbound call from patient stating he has not had a bowl movement this morning. Patient is still in route for capsule endo he has scheduled for today.

## 2023-04-17 NOTE — Telephone Encounter (Signed)
Spoke with patient regarding MD recommendations. Updated instructions sent to patient & rescheduled capsule for tomorrow 04/18/23. Pt verbalized all understanding.

## 2023-04-17 NOTE — Telephone Encounter (Signed)
Patient is scheduled for a capsule this morning. He completed prep, however has not had a bowel movement since starting the prep. Advised patient that I would reach out to MD to see if there is an alternate prep he recommends & then will call patient back to reschedule.

## 2023-04-18 ENCOUNTER — Ambulatory Visit (INDEPENDENT_AMBULATORY_CARE_PROVIDER_SITE_OTHER): Payer: BC Managed Care – PPO | Admitting: Internal Medicine

## 2023-04-18 DIAGNOSIS — D509 Iron deficiency anemia, unspecified: Secondary | ICD-10-CM | POA: Diagnosis not present

## 2023-04-18 DIAGNOSIS — R195 Other fecal abnormalities: Secondary | ICD-10-CM

## 2023-04-18 NOTE — Patient Instructions (Signed)
You may have clear liquids beginning at 10:30 am after ingesting the capsule.    You can have a light lunch at 12:30 pm; sandwich and half bowl of soup.  Return to the office at 4 pm to return the equipment.   Return to you normal diet at 5 pm.   Call [phone] and ask for Mahiya Kercheval, RN if you have any questions.  You should pass the capsule in your stool 8-48 hours after ingestion. If you have not passed the capsule, after 72 hours, please contact the office at [phone].    

## 2023-04-18 NOTE — Progress Notes (Signed)
SN: [id number] Exp: 2024-07-19 LOT: [id number]  Patient arrived for VCE. Reported the prep went well. This RN explained capsule dietary restrictions for the next few hours. Pt advised to return at 4 pm to return capsule equipment.  Patient verbalized understanding. Opened capsule, ensured capsule was flashing prior to the patient swallowing the capsule. Patient swallowed capsule without difficulty.  Patient told to call the office with any questions and if capsule has not passed after 72 hours. No further questions by the conclusion of the visit.

## 2023-05-23 ENCOUNTER — Telehealth: Payer: Self-pay | Admitting: Internal Medicine

## 2023-05-23 NOTE — Telephone Encounter (Signed)
I spoke to the patient and informed him that the capsule endoscopy of the small bowel was normal without bleeding lesions.  Continue care through hematology see GI as needed.

## 2023-05-27 ENCOUNTER — Encounter: Payer: Self-pay | Admitting: Internal Medicine

## 2023-10-10 ENCOUNTER — Inpatient Hospital Stay: Payer: BC Managed Care – PPO | Admitting: Hematology and Oncology

## 2023-10-10 ENCOUNTER — Inpatient Hospital Stay: Payer: BC Managed Care – PPO | Attending: Internal Medicine

## 2023-10-10 ENCOUNTER — Other Ambulatory Visit: Payer: Self-pay | Admitting: Hematology and Oncology

## 2023-10-10 DIAGNOSIS — Z79899 Other long term (current) drug therapy: Secondary | ICD-10-CM | POA: Diagnosis not present

## 2023-10-10 LAB — IRON AND IRON BINDING CAPACITY (CC-WL,HP ONLY)
Iron: 190 ug/dL — ABNORMAL HIGH (ref 45–182)
Saturation Ratios: 53 % — ABNORMAL HIGH (ref 17.9–39.5)
TIBC: 356 ug/dL (ref 250–450)
UIBC: 166 ug/dL (ref 117–376)

## 2023-10-10 LAB — CBC WITH DIFFERENTIAL (CANCER CENTER ONLY)
Abs Immature Granulocytes: 0.02 10*3/uL (ref 0.00–0.07)
Basophils Absolute: 0 10*3/uL (ref 0.0–0.1)
Basophils Relative: 0 %
Eosinophils Absolute: 0.2 10*3/uL (ref 0.0–0.5)
Eosinophils Relative: 4 %
HCT: 41.8 % (ref 39.0–52.0)
Hemoglobin: 14.8 g/dL (ref 13.0–17.0)
Immature Granulocytes: 0 %
Lymphocytes Relative: 29 %
Lymphs Abs: 1.5 10*3/uL (ref 0.7–4.0)
MCH: 34.3 pg — ABNORMAL HIGH (ref 26.0–34.0)
MCHC: 35.4 g/dL (ref 30.0–36.0)
MCV: 97 fL (ref 80.0–100.0)
Monocytes Absolute: 0.5 10*3/uL (ref 0.1–1.0)
Monocytes Relative: 10 %
Neutro Abs: 2.8 10*3/uL (ref 1.7–7.7)
Neutrophils Relative %: 57 %
Platelet Count: 198 10*3/uL (ref 150–400)
RBC: 4.31 MIL/uL (ref 4.22–5.81)
RDW: 12.1 % (ref 11.5–15.5)
WBC Count: 5 10*3/uL (ref 4.0–10.5)
nRBC: 0 % (ref 0.0–0.2)

## 2023-10-10 LAB — CMP (CANCER CENTER ONLY)
ALT: 27 U/L (ref 0–44)
AST: 23 U/L (ref 15–41)
Albumin: 4.7 g/dL (ref 3.5–5.0)
Alkaline Phosphatase: 70 U/L (ref 38–126)
Anion gap: 7 (ref 5–15)
BUN: 13 mg/dL (ref 6–20)
CO2: 32 mmol/L (ref 22–32)
Calcium: 10.3 mg/dL (ref 8.9–10.3)
Chloride: 99 mmol/L (ref 98–111)
Creatinine: 0.91 mg/dL (ref 0.61–1.24)
GFR, Estimated: >60 mL/min (ref >60)
Glucose, Bld: 187 mg/dL — ABNORMAL HIGH (ref 70–99)
Potassium: 4.4 mmol/L (ref 3.5–5.1)
Sodium: 138 mmol/L (ref 135–145)
Total Bilirubin: 0.8 mg/dL (ref <1.2)
Total Protein: 7.1 g/dL (ref 6.5–8.1)

## 2023-10-10 LAB — FERRITIN: Ferritin: 30 ng/mL (ref 24–336)

## 2023-10-10 NOTE — Progress Notes (Signed)
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 Leonides Schanz. Phlebotomies held.    Interval History:  [name] 57 y.o. male with medical history significant for compound heterozygous hemochromatosis who presents for a follow up visit. The patient's last visit was on 04/04/2023. In the interim since the last visit he underwent capsule endoscopy on 04/18/2023.   On exam today [name] notes he underwent a capsule endoscopy which showed no evidence of bleeding.  He reports he tolerated the procedure well and subsequently is had no evidence of bleeding.  He denies any bright red blood in the stool, urine, or elsewhere in his body.  He also reports that he is had no evidence of dark stools.  His appetite is good though he is trying to minimalize amount of red meat that he eats.  He prior tries his chicken.  He reports that he does eat some kale, spinach and broccoli on occasion.  He reports his energy is about 8 out of 10 today and he is not having any lightheadedness, dizziness, shortness of breath.  He has excellent exercise tolerance and is had no other symptoms recently.  Overall he feels well and has no questions concerns or complaints today.  Otherwise he has been at his baseline level of health.  A full 10 point ROS is listed below.  MEDICAL HISTORY:  Past Medical History:  Diagnosis Date   Allergy    Anemia    DDD (degenerative disc disease), cervical    DDD (degenerative disc disease), lumbar    Depression    Diabetes mellitus without complication (HCC)    type 2   Fibromyalgia    Folliculitis    Headache    migraines    Hemochromatosis-compound heterozygote    Hx of adenomatous colonic polyps 04/25/2022   Hyperlipidemia    Neuromuscular disorder (HCC)    occasional neuropathy in left foot   Sleep apnea    past hx- has lost 45lbs- no longer needs CPAP    SURGICAL HISTORY: Past Surgical History:  Procedure Laterality Date   BACK SURGERY  2020   w/ Dr. Wynetta Emery   broken nose surgery     COLONOSCOPY     LUMBAR LAMINECTOMY  07/2021   LUMBAR LAMINECTOMY/DECOMPRESSION MICRODISCECTOMY Left 09/08/2020   Procedure: Microdiscectomy - left - Lumbar five-Sacral one;  Surgeon: Donalee Citrin, MD;  Location: [facility];  Service: Neurosurgery;  Laterality: Left;   NASAL SEPTUM SURGERY     UPPER GI ENDOSCOPY     WISDOM TOOTH EXTRACTION      SOCIAL HISTORY: Social History   Socioeconomic History   Marital status: Single    Spouse name: Not on file   Number of children: Not on file   Years of education: Not on file   Highest education level: Not on file  Occupational History   Occupation: auto Agricultural consultant  Tobacco Use   Smoking status: Never   Smokeless tobacco: Current    Types: Snuff  Vaping Use   Vaping status: Never Used  Substance and Sexual Activity   Alcohol use: Yes    Comment: socially [employer]   Drug use: Never   Sexual activity: Not on file  Other  Topics Concern   Not on file  Social History Narrative   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   Patient is single his mother lives with him   Social Drivers of Corporate investment banker Strain: Not on file  Food Insecurity: Not on file  Transportation Needs: Not on file  Physical Activity: Not on file  Stress: Not on file  Social Connections: Not on file  Intimate Partner Violence: Not on file    FAMILY HISTORY: Family History  Problem Relation Age of Onset   Hypertension Mother    Diabetes Mother    Hyperlipidemia Mother    Hyperlipidemia Father    Hypertension Father    Stomach cancer Paternal Uncle    Colon cancer  Neg Hx    Colon polyps Neg Hx    Esophageal cancer Neg Hx    Rectal cancer Neg Hx     ALLERGIES:  is allergic to doxepin hcl and naproxen.  MEDICATIONS:  Current Outpatient Medications  Medication Sig Dispense Refill   acetaminophen (TYLENOL) 500 MG tablet Take 1,000 mg by mouth every 6 (six) hours as needed for moderate pain.      atorvastatin (LIPITOR) 20 MG tablet Take 20 mg by mouth daily.      baclofen (LIORESAL) 10 MG tablet Take 10-20 mg by mouth at bedtime as needed.     Cholecalciferol (VITAMIN D3) 25 MCG (1000 UT) CAPS Take 1,000 Units by mouth daily.      clindamycin (CLEOCIN T) 1 % external solution Apply 1 application topically daily as needed (scalp condition). (Patient not taking: Reported on 04/18/2022)     doxycycline (VIBRAMYCIN) 50 MG capsule Take 50 mg by mouth daily.     fluticasone (FLONASE) 50 MCG/ACT nasal spray Place 2 sprays into both nostrils daily as needed for allergies.  (Patient not taking: Reported on 04/18/2022)     levocetirizine (XYZAL) 5 MG tablet Take 5 mg by mouth daily as needed for allergies.  (Patient not taking: Reported on 04/18/2022)     metFORMIN (GLUCOPHAGE-XR) 500 MG 24 hr tablet Take 1,000 mg by mouth 2 (two) times daily with a meal.     nortriptyline (PAMELOR) 25 MG capsule Take 25-50 mg by mouth at bedtime as needed for sleep.      Omega-3 Fatty Acids (FISH OIL) 1200 MG CAPS Take 1,200 mg by mouth 3 (three) times daily with meals.     pregabalin (LYRICA) 75 MG capsule Take 75-150 mg by mouth See admin instructions. Take 75 mg by mouth at lunch and 150 mg at bedtime     SUMAtriptan (IMITREX) 100 MG tablet Take 50-100 mg by mouth every 2 (two) hours as needed for migraine.     No current facility-administered medications for this visit.    REVIEW OF SYSTEMS:   Constitutional: ( - ) fevers, ( - )  chills , ( - ) night sweats Eyes: ( - ) blurriness of vision, ( - ) double vision, ( - ) watery eyes Ears, nose, mouth, throat, and face: ( - )  mucositis, ( - ) sore throat Respiratory: ( - ) cough, ( - ) dyspnea, ( - ) wheezes Cardiovascular: ( - ) palpitation, ( - ) chest discomfort, ( - ) lower extremity swelling Gastrointestinal:  ( - ) nausea, ( - ) heartburn, ( - ) change in bowel habits Skin: ( - ) abnormal skin rashes Lymphatics: ( - ) new lymphadenopathy, ( - ) easy bruising Neurological: ( - )  numbness, ( - ) tingling, ( - ) new weaknesses Behavioral/Psych: ( - ) mood change, ( - ) new changes  All other systems were reviewed with the patient and are negative.  PHYSICAL EXAMINATION:  Vitals:   10/10/23 1053  BP: (!) 176/74  Pulse: (!) 53  Resp: 16  Temp: (!) 97.3 F (36.3 C)  SpO2: 100%   Filed Weights   10/10/23 1053  Weight: 156 lb 3.2 oz (70.9 kg)    GENERAL: Well-appearing middle-age Caucasian male alert, no distress and comfortable SKIN: skin color, texture, turgor are normal, no rashes or significant lesions EYES: conjunctiva are pink and non-injected, sclera clear LUNGS: clear to auscultation and percussion with normal breathing effort HEART: regular rate & rhythm and no murmurs and no lower extremity edema ABDOMEN: soft, non-tender, non-distended, normal bowel sounds Musculoskeletal: no cyanosis of digits and no clubbing  PSYCH: alert & oriented x 3, fluent speech NEURO: no focal motor/sensory deficits  LABORATORY DATA:  I have reviewed the data as listed    Latest Ref Rng & Units 10/10/2023   10:34 AM 04/04/2023   11:23 AM 09/27/2022   10:17 AM  CBC  WBC 4.0 - 10.5 K/uL 5.0  3.8  3.9   Hemoglobin 13.0 - 17.0 g/dL 16.1  09.6  04.5   Hematocrit 39.0 - 52.0 % 41.8  39.7  40.1   Platelets 150 - 400 K/uL 198  205  197        Latest Ref Rng & Units 10/10/2023   10:34 AM 04/04/2023   11:23 AM 09/27/2022   10:17 AM  CMP  Glucose 70 - 99 mg/dL 409  811  914   BUN 6 - 20 mg/dL 13  12  13    Creatinine 0.61 - 1.24 mg/dL 7.82  9.56  2.13   Sodium 135 - 145 mmol/L 138  139  139   Potassium 3.5 - 5.1  mmol/L 4.4  4.5  4.6   Chloride 98 - 111 mmol/L 99  100  101   CO2 22 - 32 mmol/L 32  32  31   Calcium 8.9 - 10.3 mg/dL 08.6  57.8  9.9   Total Protein 6.5 - 8.1 g/dL 7.1  7.0  6.8   Total Bilirubin <1.2 mg/dL 0.8  0.7  0.7   Alkaline Phos 38 - 126 U/L 70  64  68   AST 15 - 41 U/L 23  26  25    ALT 0 - 44 U/L 27  25  20      RADIOGRAPHIC STUDIES:  No results found.  ASSESSMENT & PLAN [name] 57 y.o. male with medical history significant for compound heterozygous hemochromatosis who presents for a follow up visit.  After review of the labs, review of the records, and discussion with the patient the patients findings are most consistent with a compound heterozygote hemochromatosis.  Typically these patients have more benign clinical courses and rarely suffer from iron overload.  Therefore I do not think we need a particular restrictive goal for his ferritin.  Patient has had a consistently low ferritin since at least 2012.  I would recommend that we try to keep his levels down from extreme, but elevations into the normal range would not be unreasonable at this time.  The patient voices understanding of this plan moving forward.   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  Last phlebotomy performed in May 2022 --We will check CBC, CMP, iron panel, and ferritin at each visit. --labs today show WBC 5.0, Hgb 14.8, MCV 97, Plt 198. Last ferritin 22 on 04/04/2023.  --Have patient return to clinic in 6 months for labs and return clinic visit in 12 months.   #Hemoccult Positive Stools # Persistently Low Iron -- Patient currently following with Dr. Leone Payor and GI --underwent EGD and colonoscopy on 04/18/2022 -- underwent capsule endoscopy in June 2024 with no evidence of GI bleeding.   No orders of the defined  types were placed in this encounter.  All questions were answered. The patient knows to call the clinic with any problems, questions or concerns.  A total of more than 30 minutes were spent on this encounter with face-to-face time and non-face-to-face time, including preparing to see the patient, ordering tests and/or medications, counseling the patient and coordination of care as outlined above.   Ulysees Barns, MD Department of Hematology/Oncology [facility] at [facility] Phone: [phone] Pager: [phone] Email: [email or web address]  10/19/2023 5:24 PM

## 2024-04-09 ENCOUNTER — Other Ambulatory Visit: Payer: Self-pay | Admitting: Hematology and Oncology

## 2024-04-09 ENCOUNTER — Inpatient Hospital Stay: Payer: BC Managed Care – PPO | Attending: Hematology and Oncology

## 2024-04-09 LAB — CMP (CANCER CENTER ONLY)
ALT: 24 U/L (ref 0–44)
AST: 25 U/L (ref 15–41)
Albumin: 4.7 g/dL (ref 3.5–5.0)
Alkaline Phosphatase: 78 U/L (ref 38–126)
Anion gap: 8 (ref 5–15)
BUN: 12 mg/dL (ref 6–20)
CO2: 30 mmol/L (ref 22–32)
Calcium: 9.8 mg/dL (ref 8.9–10.3)
Chloride: 98 mmol/L (ref 98–111)
Creatinine: 0.86 mg/dL (ref 0.61–1.24)
GFR, Estimated: >60 mL/min (ref >60)
Glucose, Bld: 146 mg/dL — ABNORMAL HIGH (ref 70–99)
Potassium: 4.5 mmol/L (ref 3.5–5.1)
Sodium: 136 mmol/L (ref 135–145)
Total Bilirubin: 0.7 mg/dL (ref 0.0–1.2)
Total Protein: 7.1 g/dL (ref 6.5–8.1)

## 2024-04-09 LAB — CBC WITH DIFFERENTIAL (CANCER CENTER ONLY)
Abs Immature Granulocytes: 0.01 10*3/uL (ref 0.00–0.07)
Basophils Absolute: 0 10*3/uL (ref 0.0–0.1)
Basophils Relative: 1 %
Eosinophils Absolute: 0.2 10*3/uL (ref 0.0–0.5)
Eosinophils Relative: 3 %
HCT: 38.7 % — ABNORMAL LOW (ref 39.0–52.0)
Hemoglobin: 14.2 g/dL (ref 13.0–17.0)
Immature Granulocytes: 0 %
Lymphocytes Relative: 16 %
Lymphs Abs: 1.1 10*3/uL (ref 0.7–4.0)
MCH: 34.5 pg — ABNORMAL HIGH (ref 26.0–34.0)
MCHC: 36.7 g/dL — ABNORMAL HIGH (ref 30.0–36.0)
MCV: 94.2 fL (ref 80.0–100.0)
Monocytes Absolute: 0.6 10*3/uL (ref 0.1–1.0)
Monocytes Relative: 10 %
Neutro Abs: 4.5 10*3/uL (ref 1.7–7.7)
Neutrophils Relative %: 70 %
Platelet Count: 225 10*3/uL (ref 150–400)
RBC: 4.11 MIL/uL — ABNORMAL LOW (ref 4.22–5.81)
RDW: 12.1 % (ref 11.5–15.5)
WBC Count: 6.5 10*3/uL (ref 4.0–10.5)
nRBC: 0 % (ref 0.0–0.2)

## 2024-04-09 LAB — FERRITIN: Ferritin: 71 ng/mL (ref 24–336)

## 2024-04-21 ENCOUNTER — Other Ambulatory Visit: Payer: Self-pay | Admitting: Family Medicine

## 2024-04-21 ENCOUNTER — Ambulatory Visit
Admission: RE | Admit: 2024-04-21 | Discharge: 2024-04-21 | Disposition: A | Discharge status: Home / Self Care | Source: Ambulatory Visit | Attending: Family Medicine | Admitting: Family Medicine

## 2024-04-21 DIAGNOSIS — R5383 Other fatigue: Secondary | ICD-10-CM

## 2024-07-26 ENCOUNTER — Other Ambulatory Visit (HOSPITAL_BASED_OUTPATIENT_CLINIC_OR_DEPARTMENT_OTHER): Payer: Self-pay | Admitting: Family Medicine

## 2024-07-26 DIAGNOSIS — E782 Mixed hyperlipidemia: Secondary | ICD-10-CM

## 2024-08-19 ENCOUNTER — Encounter (HOSPITAL_BASED_OUTPATIENT_CLINIC_OR_DEPARTMENT_OTHER): Payer: Self-pay

## 2024-08-19 ENCOUNTER — Other Ambulatory Visit (HOSPITAL_BASED_OUTPATIENT_CLINIC_OR_DEPARTMENT_OTHER)

## 2024-10-11 ENCOUNTER — Inpatient Hospital Stay: Payer: BC Managed Care – PPO

## 2024-10-11 ENCOUNTER — Inpatient Hospital Stay: Payer: BC Managed Care – PPO | Admitting: Hematology and Oncology

## 2024-10-14 ENCOUNTER — Ambulatory Visit: Payer: Self-pay | Admitting: *Deleted

## 2024-10-14 ENCOUNTER — Other Ambulatory Visit: Payer: Self-pay | Admitting: *Deleted

## 2024-10-14 ENCOUNTER — Other Ambulatory Visit: Payer: Self-pay | Admitting: Hematology and Oncology

## 2024-10-14 ENCOUNTER — Inpatient Hospital Stay: Attending: Hematology and Oncology | Admitting: Hematology and Oncology

## 2024-10-14 ENCOUNTER — Inpatient Hospital Stay

## 2024-10-14 LAB — CMP (CANCER CENTER ONLY)
ALT: 22 U/L (ref 0–44)
AST: 26 U/L (ref 15–41)
Albumin: 4.6 g/dL (ref 3.5–5.0)
Alkaline Phosphatase: 67 U/L (ref 38–126)
Anion gap: 11 (ref 5–15)
BUN: 17 mg/dL (ref 6–20)
CO2: 27 mmol/L (ref 22–32)
Calcium: 10.1 mg/dL (ref 8.9–10.3)
Chloride: 101 mmol/L (ref 98–111)
Creatinine: 1.02 mg/dL (ref 0.61–1.24)
GFR, Estimated: >60 mL/min (ref >60)
Glucose, Bld: 172 mg/dL — ABNORMAL HIGH (ref 70–99)
Potassium: 5.1 mmol/L (ref 3.5–5.1)
Sodium: 139 mmol/L (ref 135–145)
Total Bilirubin: 0.6 mg/dL (ref 0.0–1.2)
Total Protein: 7 g/dL (ref 6.5–8.1)

## 2024-10-14 LAB — CBC WITH DIFFERENTIAL (CANCER CENTER ONLY)
Abs Immature Granulocytes: 0.01 K/uL (ref 0.00–0.07)
Basophils Absolute: 0 K/uL (ref 0.0–0.1)
Basophils Relative: 1 %
Eosinophils Absolute: 0.2 K/uL (ref 0.0–0.5)
Eosinophils Relative: 5 %
HCT: 37.5 % — ABNORMAL LOW (ref 39.0–52.0)
Hemoglobin: 13.4 g/dL (ref 13.0–17.0)
Immature Granulocytes: 0 %
Lymphocytes Relative: 28 %
Lymphs Abs: 1.1 K/uL (ref 0.7–4.0)
MCH: 33.8 pg (ref 26.0–34.0)
MCHC: 35.7 g/dL (ref 30.0–36.0)
MCV: 94.7 fL (ref 80.0–100.0)
Monocytes Absolute: 0.5 K/uL (ref 0.1–1.0)
Monocytes Relative: 11 %
Neutro Abs: 2.3 K/uL (ref 1.7–7.7)
Neutrophils Relative %: 55 %
Platelet Count: 186 K/uL (ref 150–400)
RBC: 3.96 MIL/uL — ABNORMAL LOW (ref 4.22–5.81)
RDW: 12 % (ref 11.5–15.5)
WBC Count: 4.2 K/uL (ref 4.0–10.5)
nRBC: 0 % (ref 0.0–0.2)

## 2024-10-14 LAB — FERRITIN: Ferritin: 93 ng/mL (ref 24–336)

## 2024-10-14 NOTE — Progress Notes (Signed)
 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 Federico. Phlebotomies held.    Interval History:  [name] 58 y.o. male with medical history significant for compound heterozygous hemochromatosis who presents for a follow up visit. The patient's last visit was on 10/10/2023. In the interim since the last visit he has had no major changes in his health.  On exam today Mr. Bouse notes he has been well overall in interim since our last visit 1 year ago.  He has had no viral illnesses such as fevers, chills, sweats, nausea, vomiting or diarrhea.  He denies any runny nose, sore throat, cough.  He reports he had no ER visits, hospitalizations, or new medications.  He reports his energy level today is about an 8 out of 10.  He said no trouble with lightheadedness, dizziness, shortness of breath.  Overall he reports his appetite remains good and there are no limitations in his day-to-day activity.  He is having no overt signs of bleeding such as nosebleeds, gum bleeding, or blood in the urine/stool.  He denies any dark stools.  He reports that he has not been evaluated by his GI doctors since his last capsule endoscopy.  He also has not required phlebotomy since at least 2022.  Overall he feels well and has no additional questions concerns or complaints today.  A full 10 point ROS is otherwise negative.  MEDICAL HISTORY:  Past Medical History:  Diagnosis Date   Allergy    Anemia    DDD (degenerative disc disease), cervical    DDD (degenerative disc disease), lumbar    Depression    Diabetes mellitus without complication (HCC)    type 2    Fibromyalgia    Folliculitis    Headache    migraines   Hemochromatosis-compound heterozygote    Hx of adenomatous colonic polyps 04/25/2022   Hyperlipidemia    Neuromuscular disorder (HCC)    occasional neuropathy in left foot   Sleep apnea    past hx- has lost 45lbs- no longer needs CPAP    SURGICAL HISTORY: Past Surgical History:  Procedure Laterality Date   BACK SURGERY  2020   w/ Dr. Onetha   broken nose surgery     COLONOSCOPY     LUMBAR LAMINECTOMY  07/2021   LUMBAR LAMINECTOMY/DECOMPRESSION MICRODISCECTOMY Left 09/08/2020   Procedure: Microdiscectomy - left - Lumbar five-Sacral one;  Surgeon: Onetha Kuba, MD;  Location: [facility] OR;  Service: Neurosurgery;  Laterality: Left;   NASAL SEPTUM SURGERY     UPPER GI ENDOSCOPY     WISDOM TOOTH EXTRACTION      SOCIAL HISTORY: Social History   Socioeconomic History   Marital status: Single    Spouse name: Not on file   Number of children: Not on file   Years of education: Not on file   Highest education level: Not on file  Occupational History   Occupation: auto agricultural consultant  Tobacco Use   Smoking status: Never   Smokeless tobacco: Current    Types: Snuff  Vaping Use   Vaping status: Never Used  Substance and Sexual Activity   Alcohol use: Yes    Comment: socially Directv   Drug use: Never  Sexual activity: Not on file  Other Topics Concern   Not on file  Social History Narrative   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   Patient is single his mother lives with him   Social Drivers of Health   Tobacco Use: High Risk (04/25/2022)   Patient History    Smoking Tobacco Use: Never    Smokeless Tobacco Use: Current    Passive Exposure: Not on file  Financial Resource Strain: Not on file  Food Insecurity: Not on file  Transportation Needs: Not on file  Physical Activity: Not on file  Stress: Not on file  Social Connections: Not on file  Intimate Partner Violence: Not on file  Depression (EYV7-0):  Not on file  Alcohol Screen: Not on file  Housing: Not on file  Utilities: Not on file  Health Literacy: Not on file    FAMILY HISTORY: Family History  Problem Relation Age of Onset   Hypertension Mother    Diabetes Mother    Hyperlipidemia Mother    Hyperlipidemia Father    Hypertension Father    Stomach cancer Paternal Uncle    Colon cancer Neg Hx    Colon polyps Neg Hx    Esophageal cancer Neg Hx    Rectal cancer Neg Hx     ALLERGIES:  is allergic to doxepin hcl and naproxen.  MEDICATIONS:  Current Outpatient Medications  Medication Sig Dispense Refill   acetaminophen  (TYLENOL ) 500 MG tablet Take 1,000 mg by mouth every 6 (six) hours as needed for moderate pain.      baclofen  (LIORESAL ) 10 MG tablet Take 10-20 mg by mouth at bedtime as needed.     Cholecalciferol  (VITAMIN D3) 25 MCG (1000 UT) CAPS Take 1,000 Units by mouth daily.      clindamycin  (CLEOCIN  T) 1 % external solution Apply 1 application topically daily as needed (scalp condition).     fluticasone  (FLONASE ) 50 MCG/ACT nasal spray Place 2 sprays into both nostrils daily as needed for allergies.      levocetirizine (XYZAL ) 5 MG tablet Take 5 mg by mouth daily as needed for allergies.      metFORMIN  (GLUCOPHAGE -XR) 500 MG 24 hr tablet Take 1,000 mg by mouth 2 (two) times daily with a meal.     nortriptyline  (PAMELOR ) 25 MG capsule Take 25-50 mg by mouth at bedtime as needed for sleep.      Omega-3 Fatty Acids (FISH OIL) 1200 MG CAPS Take 1,200 mg by mouth 3 (three) times daily with meals.     pregabalin  (LYRICA ) 75 MG capsule Take 75-150 mg by mouth See admin instructions. Take 75 mg by mouth at lunch and 150 mg at bedtime     SUMAtriptan  (IMITREX ) 100 MG tablet Take 50-100 mg by mouth every 2 (two) hours as needed for migraine.     atorvastatin  (LIPITOR) 20 MG tablet Take 20 mg by mouth daily.  (Patient not taking: Reported on 10/14/2024)     No current facility-administered medications for this visit.     REVIEW OF SYSTEMS:   Constitutional: ( - ) fevers, ( - )  chills , ( - ) night sweats Eyes: ( - ) blurriness of vision, ( - ) double vision, ( - ) watery eyes Ears, nose, mouth, throat, and face: ( - ) mucositis, ( - ) sore throat Respiratory: ( - ) cough, ( - ) dyspnea, ( - ) wheezes Cardiovascular: ( - ) palpitation, ( - ) chest discomfort, ( - )  lower extremity swelling Gastrointestinal:  ( - ) nausea, ( - ) heartburn, ( - ) change in bowel habits Skin: ( - ) abnormal skin rashes Lymphatics: ( - ) new lymphadenopathy, ( - ) easy bruising Neurological: ( - ) numbness, ( - ) tingling, ( - ) new weaknesses Behavioral/Psych: ( - ) mood change, ( - ) new changes  All other systems were reviewed with the patient and are negative.  PHYSICAL EXAMINATION:  Vitals:   10/14/24 1128  BP: (!) 162/71  Pulse: (!) 57  Resp: 16  Temp: (!) 97.3 F (36.3 C)  SpO2: 100%    Filed Weights   10/14/24 1128  Weight: 156 lb 4.8 oz (70.9 kg)     GENERAL: Well-appearing middle-age Caucasian male alert, no distress and comfortable SKIN: skin color, texture, turgor are normal, no rashes or significant lesions EYES: conjunctiva are pink and non-injected, sclera clear LUNGS: clear to auscultation and percussion with normal breathing effort HEART: regular rate & rhythm and no murmurs and no lower extremity edema ABDOMEN: soft, non-tender, non-distended, normal bowel sounds Musculoskeletal: no cyanosis of digits and no clubbing  PSYCH: alert & oriented x 3, fluent speech NEURO: no focal motor/sensory deficits  LABORATORY DATA:  I have reviewed the data as listed    Latest Ref Rng & Units 10/14/2024   11:12 AM 04/09/2024    1:59 PM 10/10/2023   10:34 AM  CBC  WBC 4.0 - 10.5 K/uL 4.2  6.5  5.0   Hemoglobin 13.0 - 17.0 g/dL 86.5  85.7  85.1   Hematocrit 39.0 - 52.0 % 37.5  38.7  41.8   Platelets 150 - 400 K/uL 186  225  198        Latest Ref Rng & Units 10/14/2024   11:12 AM 04/09/2024     1:59 PM 10/10/2023   10:34 AM  CMP  Glucose 70 - 99 mg/dL 827  853  812   BUN 6 - 20 mg/dL 17  12  13    Creatinine 0.61 - 1.24 mg/dL 8.97  9.13  9.08   Sodium 135 - 145 mmol/L 139  136  138   Potassium 3.5 - 5.1 mmol/L 5.1  4.5  4.4   Chloride 98 - 111 mmol/L 101  98  99   CO2 22 - 32 mmol/L 27  30  32   Calcium  8.9 - 10.3 mg/dL 89.8  9.8  89.6   Total Protein 6.5 - 8.1 g/dL 7.0  7.1  7.1   Total Bilirubin 0.0 - 1.2 mg/dL 0.6  0.7  0.8   Alkaline Phos 38 - 126 U/L 67  78  70   AST 15 - 41 U/L 26  25  23    ALT 0 - 44 U/L 22  24  27      RADIOGRAPHIC STUDIES:  No results found.  ASSESSMENT & PLAN Kollen Armenti Stiehl 58 y.o. male with medical history significant for compound heterozygous hemochromatosis who presents for a follow up visit.  After review of the labs, review of the records, and discussion with the patient the patients findings are most consistent with a compound heterozygote hemochromatosis.  Typically these patients have more benign clinical courses and rarely suffer from iron overload.  Therefore I do not think we need a particular restrictive goal for his ferritin.  Patient has had a consistently low ferritin since at least 2012.  I would recommend that we try to keep his levels down from extreme, but elevations  into the normal range would not be unreasonable at this time.  The patient voices understanding of this plan moving forward.   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  Last phlebotomy performed in May 2022 --We will check CBC, CMP, iron panel, and ferritin at each visit. --labs today show WBC 4.2, Hgb 13.4, MCV 94.7, Plt 186. Last ferritin 71 on 04/09/2024  --Have patient return to clinic in 6 months for labs and return clinic visit in 12 months.   #Hemoccult Positive Stools #  Persistently Low Iron -- Patient currently following with Dr. Avram and GI --underwent EGD and colonoscopy on 04/18/2022 -- underwent capsule endoscopy in June 2024 with no evidence of GI bleeding.   No orders of the defined types were placed in this encounter.  All questions were answered. The patient knows to call the clinic with any problems, questions or concerns.  A total of more than 30 minutes were spent on this encounter with face-to-face time and non-face-to-face time, including preparing to see the patient, ordering tests and/or medications, counseling the patient and coordination of care as outlined above.   Norleen IVAR Kidney, MD Department of Hematology/Oncology [facility] at [facility] Phone: [phone] Pager: [phone] Email: [email or web address]  10/14/2024 1:58 PM

## 2024-10-14 NOTE — Telephone Encounter (Signed)
 TCT patient regarding recent lab results. Spoke with him. Advised that  his ferritin is 93, currently below our goal of 150.  He does not require phlebotomy at this time.  Will plan to see him back in 6 months for labs and 12 months for clinic visit. Pt voiced understanding

## 2024-10-14 NOTE — Telephone Encounter (Signed)
-----   Message from Norleen Kidney, MD sent at 10/14/2024  1:58 PM EST ----- Please let [name] know that his ferritin is 93, currently below our goal of 150.  He does not require phlebotomy at this time.  Will plan to see him back in 6 months for labs and 12 months for  clinic visit.

## 2025-04-13 ENCOUNTER — Inpatient Hospital Stay

## 2025-10-05 ENCOUNTER — Inpatient Hospital Stay

## 2025-10-12 ENCOUNTER — Inpatient Hospital Stay: Admitting: Hematology and Oncology
